# Patient Record
Sex: Male | Born: 1940 | Race: White | Hispanic: No | Smoking: Never smoker
Health system: Southern US, Community
[De-identification: ages and names within clinical notes are randomized; demographics above are authoritative.]

## PROBLEM LIST (undated history)

## (undated) DIAGNOSIS — F329 Major depressive disorder, single episode, unspecified: Secondary | ICD-10-CM

## (undated) DIAGNOSIS — Z789 Other specified health status: Secondary | ICD-10-CM

## (undated) DIAGNOSIS — Z87442 Personal history of urinary calculi: Secondary | ICD-10-CM

## (undated) DIAGNOSIS — T4145XA Adverse effect of unspecified anesthetic, initial encounter: Secondary | ICD-10-CM

## (undated) DIAGNOSIS — F32A Depression, unspecified: Secondary | ICD-10-CM

## (undated) DIAGNOSIS — T8859XA Other complications of anesthesia, initial encounter: Secondary | ICD-10-CM

## (undated) DIAGNOSIS — M1990 Unspecified osteoarthritis, unspecified site: Secondary | ICD-10-CM

## (undated) DIAGNOSIS — F419 Anxiety disorder, unspecified: Secondary | ICD-10-CM

## (undated) DIAGNOSIS — R0600 Dyspnea, unspecified: Secondary | ICD-10-CM

## (undated) HISTORY — PX: NECK SURGERY: SHX720

## (undated) HISTORY — PX: KNEE ARTHROSCOPY: SUR90

---
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
## 2005-08-07 ENCOUNTER — Ambulatory Visit: Payer: Self-pay | Admitting: Internal Medicine

## 2005-08-07 ENCOUNTER — Ambulatory Visit (HOSPITAL_COMMUNITY): Admission: RE | Admit: 2005-08-07 | Discharge: 2005-08-07 | Payer: Self-pay | Admitting: Internal Medicine

## 2011-05-17 DIAGNOSIS — Z23 Encounter for immunization: Secondary | ICD-10-CM | POA: Diagnosis not present

## 2012-01-31 DIAGNOSIS — Z79899 Other long term (current) drug therapy: Secondary | ICD-10-CM | POA: Diagnosis not present

## 2012-01-31 DIAGNOSIS — M1990 Unspecified osteoarthritis, unspecified site: Secondary | ICD-10-CM | POA: Diagnosis not present

## 2012-01-31 DIAGNOSIS — Z125 Encounter for screening for malignant neoplasm of prostate: Secondary | ICD-10-CM | POA: Diagnosis not present

## 2012-02-07 DIAGNOSIS — M79609 Pain in unspecified limb: Secondary | ICD-10-CM | POA: Diagnosis not present

## 2012-02-07 DIAGNOSIS — Z1212 Encounter for screening for malignant neoplasm of rectum: Secondary | ICD-10-CM | POA: Diagnosis not present

## 2012-02-07 DIAGNOSIS — N200 Calculus of kidney: Secondary | ICD-10-CM | POA: Diagnosis not present

## 2012-02-07 DIAGNOSIS — M5020 Other cervical disc displacement, unspecified cervical region: Secondary | ICD-10-CM | POA: Diagnosis not present

## 2012-03-22 DIAGNOSIS — Z23 Encounter for immunization: Secondary | ICD-10-CM | POA: Diagnosis not present

## 2012-05-06 DIAGNOSIS — K1370 Unspecified lesions of oral mucosa: Secondary | ICD-10-CM | POA: Diagnosis not present

## 2012-05-06 DIAGNOSIS — M2749 Other cysts of jaw: Secondary | ICD-10-CM | POA: Diagnosis not present

## 2013-02-08 DIAGNOSIS — Z79899 Other long term (current) drug therapy: Secondary | ICD-10-CM | POA: Diagnosis not present

## 2013-02-08 DIAGNOSIS — M1990 Unspecified osteoarthritis, unspecified site: Secondary | ICD-10-CM | POA: Diagnosis not present

## 2013-02-16 DIAGNOSIS — M5020 Other cervical disc displacement, unspecified cervical region: Secondary | ICD-10-CM | POA: Diagnosis not present

## 2013-02-16 DIAGNOSIS — Z23 Encounter for immunization: Secondary | ICD-10-CM | POA: Diagnosis not present

## 2013-02-16 DIAGNOSIS — Z1212 Encounter for screening for malignant neoplasm of rectum: Secondary | ICD-10-CM | POA: Diagnosis not present

## 2013-02-16 DIAGNOSIS — R000 Tachycardia, unspecified: Secondary | ICD-10-CM | POA: Diagnosis not present

## 2013-02-16 DIAGNOSIS — M79609 Pain in unspecified limb: Secondary | ICD-10-CM | POA: Diagnosis not present

## 2013-02-16 DIAGNOSIS — N200 Calculus of kidney: Secondary | ICD-10-CM | POA: Diagnosis not present

## 2014-02-18 DIAGNOSIS — Z79899 Other long term (current) drug therapy: Secondary | ICD-10-CM | POA: Diagnosis not present

## 2014-02-18 DIAGNOSIS — N200 Calculus of kidney: Secondary | ICD-10-CM | POA: Diagnosis not present

## 2014-02-18 DIAGNOSIS — M1990 Unspecified osteoarthritis, unspecified site: Secondary | ICD-10-CM | POA: Diagnosis not present

## 2014-02-21 DIAGNOSIS — Z23 Encounter for immunization: Secondary | ICD-10-CM | POA: Diagnosis not present

## 2014-02-21 DIAGNOSIS — Z0001 Encounter for general adult medical examination with abnormal findings: Secondary | ICD-10-CM | POA: Diagnosis not present

## 2015-02-13 DIAGNOSIS — Z23 Encounter for immunization: Secondary | ICD-10-CM | POA: Diagnosis not present

## 2015-02-20 DIAGNOSIS — E669 Obesity, unspecified: Secondary | ICD-10-CM | POA: Diagnosis not present

## 2015-02-20 DIAGNOSIS — M1990 Unspecified osteoarthritis, unspecified site: Secondary | ICD-10-CM | POA: Diagnosis not present

## 2015-02-20 DIAGNOSIS — Z125 Encounter for screening for malignant neoplasm of prostate: Secondary | ICD-10-CM | POA: Diagnosis not present

## 2015-02-20 DIAGNOSIS — N200 Calculus of kidney: Secondary | ICD-10-CM | POA: Diagnosis not present

## 2015-03-16 DIAGNOSIS — N049 Nephrotic syndrome with unspecified morphologic changes: Secondary | ICD-10-CM | POA: Diagnosis not present

## 2015-03-16 DIAGNOSIS — Z23 Encounter for immunization: Secondary | ICD-10-CM | POA: Diagnosis not present

## 2015-03-16 DIAGNOSIS — Z6836 Body mass index (BMI) 36.0-36.9, adult: Secondary | ICD-10-CM | POA: Diagnosis not present

## 2015-03-16 DIAGNOSIS — I493 Ventricular premature depolarization: Secondary | ICD-10-CM | POA: Diagnosis not present

## 2015-03-16 DIAGNOSIS — M1990 Unspecified osteoarthritis, unspecified site: Secondary | ICD-10-CM | POA: Diagnosis not present

## 2016-04-01 DIAGNOSIS — Z23 Encounter for immunization: Secondary | ICD-10-CM | POA: Diagnosis not present

## 2016-04-02 DIAGNOSIS — Z79899 Other long term (current) drug therapy: Secondary | ICD-10-CM | POA: Diagnosis not present

## 2016-04-02 DIAGNOSIS — E669 Obesity, unspecified: Secondary | ICD-10-CM | POA: Diagnosis not present

## 2016-04-02 DIAGNOSIS — M1990 Unspecified osteoarthritis, unspecified site: Secondary | ICD-10-CM | POA: Diagnosis not present

## 2016-04-16 DIAGNOSIS — Z87442 Personal history of urinary calculi: Secondary | ICD-10-CM | POA: Diagnosis not present

## 2016-04-16 DIAGNOSIS — E669 Obesity, unspecified: Secondary | ICD-10-CM | POA: Diagnosis not present

## 2016-04-16 DIAGNOSIS — Z6835 Body mass index (BMI) 35.0-35.9, adult: Secondary | ICD-10-CM | POA: Diagnosis not present

## 2016-04-16 DIAGNOSIS — M5030 Other cervical disc degeneration, unspecified cervical region: Secondary | ICD-10-CM | POA: Diagnosis not present

## 2016-04-26 ENCOUNTER — Encounter (INDEPENDENT_AMBULATORY_CARE_PROVIDER_SITE_OTHER): Payer: Self-pay | Admitting: *Deleted

## 2016-05-07 DIAGNOSIS — H2513 Age-related nuclear cataract, bilateral: Secondary | ICD-10-CM | POA: Diagnosis not present

## 2016-05-09 ENCOUNTER — Other Ambulatory Visit (INDEPENDENT_AMBULATORY_CARE_PROVIDER_SITE_OTHER): Payer: Self-pay | Admitting: *Deleted

## 2016-05-09 ENCOUNTER — Encounter (INDEPENDENT_AMBULATORY_CARE_PROVIDER_SITE_OTHER): Payer: Self-pay | Admitting: *Deleted

## 2016-05-09 DIAGNOSIS — Z1211 Encounter for screening for malignant neoplasm of colon: Secondary | ICD-10-CM | POA: Insufficient documentation

## 2016-06-28 ENCOUNTER — Telehealth (INDEPENDENT_AMBULATORY_CARE_PROVIDER_SITE_OTHER): Payer: Self-pay | Admitting: *Deleted

## 2016-06-28 ENCOUNTER — Encounter (INDEPENDENT_AMBULATORY_CARE_PROVIDER_SITE_OTHER): Payer: Self-pay | Admitting: *Deleted

## 2016-06-28 NOTE — Telephone Encounter (Signed)
Patient needs trilyte 

## 2016-07-01 ENCOUNTER — Telehealth (INDEPENDENT_AMBULATORY_CARE_PROVIDER_SITE_OTHER): Payer: Self-pay | Admitting: *Deleted

## 2016-07-01 MED ORDER — PEG 3350-KCL-NA BICARB-NACL 420 G PO SOLR: 4000.0000 mL | Freq: Once | ORAL | 0 refills | Status: AC

## 2016-07-01 MED ORDER — PEG 3350-KCL-NA BICARB-NACL 420 G PO SOLR: 4000.0000 mL | Freq: Once | ORAL | 0 refills | Status: DC

## 2016-07-01 NOTE — Telephone Encounter (Signed)
Patient needs trilyte 

## 2016-07-16 ENCOUNTER — Telehealth (INDEPENDENT_AMBULATORY_CARE_PROVIDER_SITE_OTHER): Payer: Self-pay | Admitting: *Deleted

## 2016-07-16 NOTE — Telephone Encounter (Signed)
agree

## 2016-07-16 NOTE — Telephone Encounter (Signed)
Referring MD/PCP: fagan   Procedure: tcs  Reason/Indication:  screening  Has patient had this procedure before?  Yes, 2007  If so, when, by whom and where?    Is there a family history of colon cancer?  no  Who?  What age when diagnosed?    Is patient diabetic?   no      Does patient have prosthetic heart valve or mechanical valve?  no  Do you have a pacemaker?  no  Has patient ever had endocarditis? no  Has patient had joint replacement within last 12 months?  no  Does patient tend to be constipated or take laxatives? no  Does patient have a history of alcohol/drug use?  no  Is patient on Coumadin, Plavix and/or Aspirin? no  Medications: none  Allergies: pcn  Medication Adjustment per Dr Laural Golden:   Procedure date & time: 08/14/16 at 730

## 2016-08-14 ENCOUNTER — Encounter (HOSPITAL_COMMUNITY)
Admission: RE | Disposition: A | Discharge status: Home / Self Care | Payer: Self-pay | Source: Ambulatory Visit | Attending: Internal Medicine

## 2016-08-14 ENCOUNTER — Ambulatory Visit (HOSPITAL_COMMUNITY)
Admission: RE | Admit: 2016-08-14 | Discharge: 2016-08-14 | Disposition: A | Discharge status: Home / Self Care | Payer: Medicare Other | Source: Ambulatory Visit | Attending: Internal Medicine | Admitting: Internal Medicine

## 2016-08-14 ENCOUNTER — Encounter (HOSPITAL_COMMUNITY): Payer: Self-pay

## 2016-08-14 DIAGNOSIS — Z1211 Encounter for screening for malignant neoplasm of colon: Secondary | ICD-10-CM | POA: Insufficient documentation

## 2016-08-14 DIAGNOSIS — Z79899 Other long term (current) drug therapy: Secondary | ICD-10-CM | POA: Insufficient documentation

## 2016-08-14 DIAGNOSIS — D127 Benign neoplasm of rectosigmoid junction: Secondary | ICD-10-CM | POA: Diagnosis not present

## 2016-08-14 DIAGNOSIS — Z880 Allergy status to penicillin: Secondary | ICD-10-CM | POA: Diagnosis not present

## 2016-08-14 DIAGNOSIS — K648 Other hemorrhoids: Secondary | ICD-10-CM | POA: Diagnosis not present

## 2016-08-14 DIAGNOSIS — K644 Residual hemorrhoidal skin tags: Secondary | ICD-10-CM | POA: Diagnosis not present

## 2016-08-14 DIAGNOSIS — K635 Polyp of colon: Secondary | ICD-10-CM | POA: Insufficient documentation

## 2016-08-14 DIAGNOSIS — D126 Benign neoplasm of colon, unspecified: Secondary | ICD-10-CM | POA: Diagnosis not present

## 2016-08-14 DIAGNOSIS — K5730 Diverticulosis of large intestine without perforation or abscess without bleeding: Secondary | ICD-10-CM | POA: Insufficient documentation

## 2016-08-14 HISTORY — PX: COLONOSCOPY: SHX5424

## 2016-08-14 HISTORY — PX: POLYPECTOMY: SHX5525

## 2016-08-14 HISTORY — DX: Other specified health status: Z78.9

## 2016-08-14 SURGERY — COLONOSCOPY
Anesthesia: Moderate Sedation

## 2016-08-14 MED ORDER — MIDAZOLAM HCL 5 MG/5ML IJ SOLN
INTRAMUSCULAR | Status: DC | PRN
  Administered 2016-08-14 (×2): 2 mg via INTRAVENOUS
  Administered 2016-08-14 (×2): 1 mg via INTRAVENOUS

## 2016-08-14 MED ORDER — MEPERIDINE HCL 50 MG/ML IJ SOLN
INTRAMUSCULAR | Status: AC
  Filled 2016-08-14: qty 1

## 2016-08-14 MED ORDER — MEPERIDINE HCL 50 MG/ML IJ SOLN
INTRAMUSCULAR | Status: DC | PRN
Start: 2016-08-14 — End: 2016-08-14
  Administered 2016-08-14 (×2): 25 mg via INTRAVENOUS

## 2016-08-14 MED ORDER — SIMETHICONE 40 MG/0.6ML PO SUSP
ORAL | Status: AC
  Filled 2016-08-14: qty 30

## 2016-08-14 MED ORDER — SODIUM CHLORIDE 0.9 % IV SOLN
INTRAVENOUS | Status: DC
  Administered 2016-08-14: 07:00:00 via INTRAVENOUS

## 2016-08-14 MED ORDER — MIDAZOLAM HCL 5 MG/5ML IJ SOLN
INTRAMUSCULAR | Status: AC
  Filled 2016-08-14: qty 10

## 2016-08-14 NOTE — H&P (Signed)
Kevin Ball is an 76 y.o. male.   Chief Complaint: Patient is here for colonoscopy. HPI: Patient is 76 year old Caucasian male who is here for screening colonoscopy. He denies abdominal pain change in bowel habits or rectal bleeding. Last colonoscopy was normal in 2007. Family History is negative for CRC.  Past Medical History:  Diagnosis Date  . Medical history non-contributory     Past Surgical History:  Procedure Laterality Date  . KNEE ARTHROSCOPY Right   . NECK SURGERY      History reviewed. No pertinent family history. Social History:  reports that he has never smoked. He has never used smokeless tobacco. He reports that he does not drink alcohol or use drugs.  Allergies:  Allergies  Allergen Reactions  . Penicillins Rash    Has patient had a PCN reaction causing immediate rash, facial/tongue/throat swelling, SOB or lightheadedness with hypotension: Yes Has patient had a PCN reaction causing severe rash involving mucus membranes or skin necrosis: No Has patient had a PCN reaction that required hospitalization No Has patient had a PCN reaction occurring within the last 10 years: No If all of the above answers are "NO", then may proceed with Cephalosporin use.     Medications Prior to Admission  Medication Sig Dispense Refill  . acetaminophen (TYLENOL) 500 MG tablet Take 1,000 mg by mouth every 6 (six) hours as needed for moderate pain or headache.    . clonazePAM (KLONOPIN) 0.5 MG tablet Take 0.5 mg by mouth 3 (three) times daily as needed (leg cramps).      No results found for this or any previous visit (from the past 48 hour(s)). No results found.  ROS  Blood pressure (!) 147/86, pulse 75, temperature 98.2 F (36.8 C), temperature source Oral, resp. rate 20, height 5\' 9"  (1.753 m), weight 240 lb (108.9 kg), SpO2 98 %. Physical Exam  Constitutional: He appears well-developed and well-nourished.  HENT:  Mouth/Throat: Oropharynx is clear and moist.  Eyes:  Conjunctivae are normal. No scleral icterus.  Neck: No thyromegaly present.  Cardiovascular: Normal rate, regular rhythm and normal heart sounds.   No murmur heard. Respiratory: Effort normal and breath sounds normal.  GI: Soft. He exhibits no distension and no mass. There is no tenderness.  Musculoskeletal: He exhibits no edema.  Lymphadenopathy:    He has no cervical adenopathy.  Neurological: He is alert.  Skin: Skin is warm and dry.     Assessment/Plan Average risk screening colonoscopy.  Hildred Laser, MD 08/14/2016, 7:30 AM

## 2016-08-14 NOTE — Op Note (Signed)
[facility] Patient Name: Kevin Ball Procedure Date: 08/14/2016 7:09 AM MRN: [id number] Date of Birth: 1940-08-14 Attending MD: Hildred Laser , MD CSN: [id number] Age: 76 Admit Type: Outpatient Procedure:                Colonoscopy Indications:              Screening for colorectal malignant neoplasm Providers:                Hildred Laser, MD, Lurline Del, RN, Charlyne Petrin                            RN, RN Referring MD:             Asencion Noble, MD Medicines:                Meperidine 50 mg IV, Midazolam 6 mg IV Complications:            No immediate complications. Estimated Blood Loss:     Estimated blood loss: none. Estimated blood loss                            was minimal. Procedure:                Pre-Anesthesia Assessment:                           - Prior to the procedure, a History and Physical                            was performed, and patient medications and                            allergies were reviewed. The patient's tolerance of                            previous anesthesia was also reviewed. The risks                            and benefits of the procedure and the sedation                            options and risks were discussed with the patient.                            All questions were answered, and informed consent                            was obtained. Prior Anticoagulants: The patient has                            taken no previous anticoagulant or antiplatelet                            agents. ASA Grade Assessment: I - A normal, healthy  patient. After reviewing the risks and benefits,                            the patient was deemed in satisfactory condition to                            undergo the procedure.                           After obtaining informed consent, the colonoscope                            was passed under direct vision. Throughout the                            procedure, the  patient's blood pressure, pulse, and                            oxygen saturations were monitored continuously. The                            EC-[id number] ([id number]) scope was introduced through                            the anus and advanced to the the cecum, identified                            by appendiceal orifice and ileocecal valve. The                            colonoscopy was performed without difficulty. The                            patient tolerated the procedure well. The quality                            of the bowel preparation was excellent. The                            ileocecal valve, appendiceal orifice, and rectum                            were photographed. Scope In: 7:41:35 AM Scope Out: 8:01:35 AM Scope Withdrawal Time: 0 hours 11 minutes 12 seconds  Total Procedure Duration: 0 hours 20 minutes 0 seconds  Findings:      The perianal and digital rectal examinations were normal.      A single small-mouthed diverticulum was found in the distal sigmoid       colon.      A small polyp was found in the recto-sigmoid colon. The polyp was       sessile. The polyp was removed with a cold snare. Resection and       retrieval were complete. The pathology specimen was placed into Bottle       Number 1.      External  and internal hemorrhoids were found during retroflexion. The       hemorrhoids were small. Impression:               - Diverticulosis in the distal sigmoid colon.                           - One small polyp at the recto-sigmoid colon,                            removed with a cold snare. Resected and retrieved.                           - External and internal hemorrhoids. Moderate Sedation:      Moderate (conscious) sedation was administered by the endoscopy nurse       and supervised by the endoscopist. The following parameters were       monitored: oxygen saturation, heart rate, blood pressure, CO2       capnography and response to care. Total physician  intraservice time was       27 minutes. Recommendation:           - Patient has a contact number available for                            emergencies. The signs and symptoms of potential                            delayed complications were discussed with the                            patient. Return to normal activities tomorrow.                            Written discharge instructions were provided to the                            patient.                           - High fiber diet today.                           - Continue present medications.                           - Await pathology results.                           - Repeat colonoscopy date to be determined after                            pending pathology results are reviewed. Procedure Code(s):        --- Professional ---                           [phone], Colonoscopy, flexible; with removal of  tumor(s), polyp(s), or other lesion(s) by snare                            technique                           99152, Moderate sedation services provided by the                            same physician or other qualified health care                            professional performing the diagnostic or                            therapeutic service that the sedation supports,                            requiring the presence of an independent trained                            observer to assist in the monitoring of the                            patient's level of consciousness and physiological                            status; initial 15 minutes of intraservice time,                            patient age 35 years or older                           [phone], Moderate sedation services; each additional                            15 minutes intraservice time Diagnosis Code(s):        --- Professional ---                           Z12.11, Encounter for screening for malignant                             neoplasm of colon                           K64.8, Other hemorrhoids                           D12.7, Benign neoplasm of rectosigmoid junction                           K57.30, Diverticulosis of large intestine without                            perforation or abscess without bleeding CPT  copyright 2016 American Medical Association. All rights reserved. The codes documented in this report are preliminary and upon coder review may  be revised to meet current compliance requirements. Hildred Laser, MD Hildred Laser, MD 08/14/2016 8:08:20 AM This report has been signed electronically. Number of Addenda: 0

## 2016-08-14 NOTE — Discharge Instructions (Signed)
Resume usual medications and high fiber diet. No driving for 24 hours. Physician will call with biopsy results.  Colonoscopy, Adult, Care After This sheet gives you information about how to care for yourself after your procedure. Your doctor may also give you more specific instructions. If you have problems or questions, call your doctor. Follow these instructions at home: General instructions    For the first 24 hours after the procedure:  Do not drive or use machinery.  Do not sign important documents.  Do not drink alcohol.  Do your daily activities more slowly than normal.  Eat foods that are soft and easy to digest.  Rest often.  Take over-the-counter or prescription medicines only as told by your doctor.  It is up to you to get the results of your procedure. Ask your doctor, or the department performing the procedure, when your results will be ready. To help cramping and bloating:   Try walking around.  Put heat on your belly (abdomen) as told by your doctor. Use a heat source that your doctor recommends, such as a moist heat pack or a heating pad.  Put a towel between your skin and the heat source.  Leave the heat on for 20-30 minutes.  Remove the heat if your skin turns bright red. This is especially important if you cannot feel pain, heat, or cold. You can get burned. Eating and drinking   Drink enough fluid to keep your pee (urine) clear or pale yellow.  Return to your normal diet as told by your doctor. Avoid heavy or fried foods that are hard to digest.  Avoid drinking alcohol for as long as told by your doctor. Contact a doctor if:  You have blood in your poop (stool) 2-3 days after the procedure. Get help right away if:  You have more than a small amount of blood in your poop.  You see large clumps of tissue (blood clots) in your poop.  Your belly is swollen.  You feel sick to your stomach (nauseous).  You throw up (vomit).  You have a  fever.  You have belly pain that gets worse, and medicine does not help your pain. This information is not intended to replace advice given to you by your health care provider. Make sure you discuss any questions you have with your health care provider. Document Released: 05/18/2010 Document Revised: 01/08/2016 Document Reviewed: 01/08/2016 Elsevier Interactive Patient Education  2017 [employer]   Hemorrhoids Hemorrhoids are swollen veins in and around the rectum or anus. There are two types of hemorrhoids:  Internal hemorrhoids. These occur in the veins that are just inside the rectum. They may poke through to the outside and become irritated and painful.  External hemorrhoids. These occur in the veins that are outside of the anus and can be felt as a painful swelling or hard lump near the anus. Most hemorrhoids do not cause serious problems, and they can be managed with home treatments such as diet and lifestyle changes. If home treatments do not help your symptoms, procedures can be done to shrink or remove the hemorrhoids. What are the causes? This condition is caused by increased pressure in the anal area. This pressure may result from various things, including:  Constipation.  Straining to have a bowel movement.  Diarrhea.  Pregnancy.  Obesity.  Sitting for long periods of time.  Heavy lifting or other activity that causes you to strain.  Anal sex. What are the signs or symptoms? Symptoms of  this condition include:  Pain.  Anal itching or irritation.  Rectal bleeding.  Leakage of stool (feces).  Anal swelling.  One or more lumps around the anus. How is this diagnosed? This condition can often be diagnosed through a visual exam. Other exams or tests may also be done, such as:  Examination of the rectal area with a gloved hand (digital rectal exam).  Examination of the anal canal using a small tube (anoscope).  A blood test, if you have lost a significant  amount of blood.  A test to look inside the colon (sigmoidoscopy or colonoscopy). How is this treated? This condition can usually be treated at home. However, various procedures may be done if dietary changes, lifestyle changes, and other home treatments do not help your symptoms. These procedures can help make the hemorrhoids smaller or remove them completely. Some of these procedures involve surgery, and others do not. Common procedures include:  Rubber band ligation. Rubber bands are placed at the base of the hemorrhoids to cut off the blood supply to them.  Sclerotherapy. Medicine is injected into the hemorrhoids to shrink them.  Infrared coagulation. A type of light energy is used to get rid of the hemorrhoids.  Hemorrhoidectomy surgery. The hemorrhoids are surgically removed, and the veins that supply them are tied off.  Stapled hemorrhoidopexy surgery. A circular stapling device is used to remove the hemorrhoids and use staples to cut off the blood supply to them. Follow these instructions at home: Eating and drinking   Eat foods that have a lot of fiber in them, such as whole grains, beans, nuts, fruits, and vegetables. Ask your health care provider about taking products that have added fiber (fiber supplements).  Drink enough fluid to keep your urine clear or pale yellow. Managing pain and swelling   Take warm sitz baths for 20 minutes, 3-4 times a day to ease pain and discomfort.  If directed, apply ice to the affected area. Using ice packs between sitz baths may be helpful.  Put ice in a plastic bag.  Place a towel between your skin and the bag.  Leave the ice on for 20 minutes, 2-3 times a day. General instructions   Take over-the-counter and prescription medicines only as told by your health care provider.  Use medicated creams or suppositories as told.  Exercise regularly.  Go to the bathroom when you have the urge to have a bowel movement. Do not wait.  Avoid  straining to have bowel movements.  Keep the anal area dry and clean. Use wet toilet paper or moist towelettes after a bowel movement.  Do not sit on the toilet for long periods of time. This increases blood pooling and pain. Contact a health care provider if:  You have increasing pain and swelling that are not controlled by treatment or medicine.  You have uncontrolled bleeding.  You have difficulty having a bowel movement, or you are unable to have a bowel movement.  You have pain or inflammation outside the area of the hemorrhoids. This information is not intended to replace advice given to you by your health care provider. Make sure you discuss any questions you have with your health care provider. Document Released: 04/12/2000 Document Revised: 09/13/2015 Document Reviewed: 12/28/2014 Elsevier Interactive Patient Education  2017 [employer].    Diverticulosis Diverticulosis is a condition that develops when small pouches (diverticula) form in the wall of the large intestine (colon). The colon is where water is absorbed and stool is formed.  The pouches form when the inside layer of the colon pushes through weak spots in the outer layers of the colon. You may have a few pouches or many of them. What are the causes? The cause of this condition is not known. What increases the risk? The following factors may make you more likely to develop this condition:  Being older than age 40. Your risk for this condition increases with age. Diverticulosis is rare among people younger than age 52. By age 26, many people have it.  Eating a low-fiber diet.  Having frequent constipation.  Being overweight.  Not getting enough exercise.  Smoking.  Taking over-the-counter pain medicines, like aspirin and ibuprofen.  Having a family history of diverticulosis. What are the signs or symptoms? In most people, there are no symptoms of this condition. If you do have symptoms, they may  include:  Bloating.  Cramps in the abdomen.  Constipation or diarrhea.  Pain in the lower left side of the abdomen. How is this diagnosed? This condition is most often diagnosed during an exam for other colon problems. Because diverticulosis usually has no symptoms, it often cannot be diagnosed independently. This condition may be diagnosed by:  Using a flexible scope to examine the colon (colonoscopy).  Taking an X-ray of the colon after dye has been put into the colon (barium enema).  Doing a CT scan. How is this treated? You may not need treatment for this condition if you have never developed an infection related to diverticulosis. If you have had an infection before, treatment may include:  Eating a high-fiber diet. This may include eating more fruits, vegetables, and grains.  Taking a fiber supplement.  Taking a live bacteria supplement (probiotic).  Taking medicine to relax your colon.  Taking antibiotic medicines. Follow these instructions at home:  Drink 6-8 glasses of water or more each day to prevent constipation.  Try not to strain when you have a bowel movement.  If you have had an infection before:  Eat more fiber as directed by your health care provider or your diet and nutrition specialist (dietitian).  Take a fiber supplement or probiotic, if your health care provider approves.  Take over-the-counter and prescription medicines only as told by your health care provider.  If you were prescribed an antibiotic, take it as told by your health care provider. Do not stop taking the antibiotic even if you start to feel better.  Keep all follow-up visits as told by your health care provider. This is important. Contact a health care provider if:  You have pain in your abdomen.  You have bloating.  You have cramps.  You have not had a bowel movement in 3 days. Get help right away if:  Your pain gets worse.  Your bloating becomes very bad.  You have a  fever or chills, and your symptoms suddenly get worse.  You vomit.  You have bowel movements that are bloody or black.  You have bleeding from your rectum. Summary  Diverticulosis is a condition that develops when small pouches (diverticula) form in the wall of the large intestine (colon).  You may have a few pouches or many of them.  This condition is most often diagnosed during an exam for other colon problems.  If you have had an infection related to diverticulosis, treatment may include increasing the fiber in your diet, taking supplements, or taking medicines. This information is not intended to replace advice given to you by your health care provider.  Make sure you discuss any questions you have with your health care provider. Document Released: 01/11/2004 Document Revised: 03/04/2016 Document Reviewed: 03/04/2016 Elsevier Interactive Patient Education  2017 [employer].  Colon Polyps Polyps are tissue growths inside the body. Polyps can grow in many places, including the large intestine (colon). A polyp may be a round bump or a mushroom-shaped growth. You could have one polyp or several. Most colon polyps are noncancerous (benign). However, some colon polyps can become cancerous over time. What are the causes? The exact cause of colon polyps is not known. What increases the risk? This condition is more likely to develop in people who:  Have a family history of colon cancer or colon polyps.  Are older than 75 or older than 45 if they are African American.  Have inflammatory bowel disease, such as ulcerative colitis or Crohn disease.  Are overweight.  Smoke cigarettes.  Do not get enough exercise.  Drink too much alcohol.  Eat a diet that is:  High in fat and red meat.  Low in fiber.  Had childhood cancer that was treated with abdominal radiation. What are the signs or symptoms? Most polyps do not cause symptoms. If you have symptoms, they may  include:  Blood coming from your rectum when having a bowel movement.  Blood in your stool.The stool may look dark red or black.  A change in bowel habits, such as constipation or diarrhea. How is this diagnosed? This condition is diagnosed with a colonoscopy. This is a procedure that uses a lighted, flexible scope to look at the inside of your colon. How is this treated? Treatment for this condition involves removing any polyps that are found. Those polyps will then be tested for cancer. If cancer is found, your health care provider will talk to you about options for colon cancer treatment. Follow these instructions at home: Diet   Eat plenty of fiber, such as fruits, vegetables, and whole grains.  Eat foods that are high in calcium and vitamin D, such as milk, cheese, yogurt, eggs, liver, fish, and broccoli.  Limit foods high in fat, red meats, and processed meats, such as hot dogs, sausage, bacon, and lunch meats.  Maintain a healthy weight, or lose weight if recommended by your health care provider. General instructions   Do not smoke cigarettes.  Do not drink alcohol excessively.  Keep all follow-up visits as told by your health care provider. This is important. This includes keeping regularly scheduled colonoscopies. Talk to your health care provider about when you need a colonoscopy.  Exercise every day or as told by your health care provider. Contact a health care provider if:  You have new or worsening bleeding during a bowel movement.  You have new or increased blood in your stool.  You have a change in bowel habits.  You unexpectedly lose weight. This information is not intended to replace advice given to you by your health care provider. Make sure you discuss any questions you have with your health care provider. Document Released: 01/10/2004 Document Revised: 09/21/2015 Document Reviewed: 03/06/2015 Elsevier Interactive Patient Education  2017 [employer].

## 2016-08-21 ENCOUNTER — Encounter (HOSPITAL_COMMUNITY): Payer: Self-pay | Admitting: Internal Medicine

## 2017-02-28 DIAGNOSIS — Z23 Encounter for immunization: Secondary | ICD-10-CM | POA: Diagnosis not present

## 2017-04-17 DIAGNOSIS — M5020 Other cervical disc displacement, unspecified cervical region: Secondary | ICD-10-CM | POA: Diagnosis not present

## 2017-04-17 DIAGNOSIS — E6609 Other obesity due to excess calories: Secondary | ICD-10-CM | POA: Diagnosis not present

## 2017-04-17 DIAGNOSIS — G4762 Sleep related leg cramps: Secondary | ICD-10-CM | POA: Diagnosis not present

## 2017-04-17 DIAGNOSIS — Z79899 Other long term (current) drug therapy: Secondary | ICD-10-CM | POA: Diagnosis not present

## 2017-04-17 DIAGNOSIS — N200 Calculus of kidney: Secondary | ICD-10-CM | POA: Diagnosis not present

## 2018-03-25 DIAGNOSIS — Z23 Encounter for immunization: Secondary | ICD-10-CM | POA: Diagnosis not present

## 2018-05-08 DIAGNOSIS — M5080 Other cervical disc disorders, unspecified cervical region: Secondary | ICD-10-CM | POA: Diagnosis not present

## 2018-05-08 DIAGNOSIS — Z79899 Other long term (current) drug therapy: Secondary | ICD-10-CM | POA: Diagnosis not present

## 2018-05-08 DIAGNOSIS — E6609 Other obesity due to excess calories: Secondary | ICD-10-CM | POA: Diagnosis not present

## 2018-05-14 DIAGNOSIS — G4762 Sleep related leg cramps: Secondary | ICD-10-CM | POA: Diagnosis not present

## 2018-05-14 DIAGNOSIS — N200 Calculus of kidney: Secondary | ICD-10-CM | POA: Diagnosis not present

## 2018-05-14 DIAGNOSIS — Z6836 Body mass index (BMI) 36.0-36.9, adult: Secondary | ICD-10-CM | POA: Diagnosis not present

## 2018-05-14 DIAGNOSIS — M5090 Cervical disc disorder, unspecified, unspecified cervical region: Secondary | ICD-10-CM | POA: Diagnosis not present

## 2018-05-14 DIAGNOSIS — Z0001 Encounter for general adult medical examination with abnormal findings: Secondary | ICD-10-CM | POA: Diagnosis not present

## 2018-06-15 DIAGNOSIS — J111 Influenza due to unidentified influenza virus with other respiratory manifestations: Secondary | ICD-10-CM | POA: Diagnosis not present

## 2018-11-20 ENCOUNTER — Other Ambulatory Visit (HOSPITAL_COMMUNITY): Payer: Self-pay | Admitting: Internal Medicine

## 2018-11-20 ENCOUNTER — Other Ambulatory Visit: Payer: Self-pay

## 2018-11-20 ENCOUNTER — Ambulatory Visit (HOSPITAL_COMMUNITY)
Admission: RE | Admit: 2018-11-20 | Discharge: 2018-11-20 | Disposition: A | Discharge status: Home / Self Care | Payer: Medicare Other | Source: Ambulatory Visit | Attending: Internal Medicine | Admitting: Internal Medicine

## 2018-11-20 DIAGNOSIS — R102 Pelvic and perineal pain: Secondary | ICD-10-CM | POA: Diagnosis not present

## 2018-11-20 DIAGNOSIS — M1612 Unilateral primary osteoarthritis, left hip: Secondary | ICD-10-CM | POA: Diagnosis not present

## 2018-11-20 DIAGNOSIS — M25552 Pain in left hip: Secondary | ICD-10-CM

## 2018-11-24 DIAGNOSIS — M1612 Unilateral primary osteoarthritis, left hip: Secondary | ICD-10-CM | POA: Diagnosis not present

## 2019-01-05 DIAGNOSIS — M25552 Pain in left hip: Secondary | ICD-10-CM | POA: Diagnosis not present

## 2019-01-07 DIAGNOSIS — M1612 Unilateral primary osteoarthritis, left hip: Secondary | ICD-10-CM | POA: Diagnosis not present

## 2019-02-12 DIAGNOSIS — Z23 Encounter for immunization: Secondary | ICD-10-CM | POA: Diagnosis not present

## 2019-03-30 DIAGNOSIS — M25552 Pain in left hip: Secondary | ICD-10-CM | POA: Diagnosis not present

## 2019-03-31 ENCOUNTER — Other Ambulatory Visit: Payer: Self-pay | Admitting: Emergency Medicine

## 2019-03-31 ENCOUNTER — Other Ambulatory Visit: Payer: Self-pay | Admitting: Orthopedic Surgery

## 2019-03-31 ENCOUNTER — Other Ambulatory Visit (HOSPITAL_COMMUNITY): Payer: Self-pay | Admitting: Orthopedic Surgery

## 2019-03-31 DIAGNOSIS — M25552 Pain in left hip: Secondary | ICD-10-CM

## 2019-04-06 ENCOUNTER — Ambulatory Visit (HOSPITAL_COMMUNITY)
Admission: RE | Admit: 2019-04-06 | Discharge: 2019-04-06 | Disposition: A | Discharge status: Home / Self Care | Payer: Medicare Other | Source: Ambulatory Visit | Attending: Orthopedic Surgery | Admitting: Orthopedic Surgery

## 2019-04-06 ENCOUNTER — Other Ambulatory Visit: Payer: Self-pay

## 2019-04-06 DIAGNOSIS — M1612 Unilateral primary osteoarthritis, left hip: Secondary | ICD-10-CM | POA: Diagnosis not present

## 2019-04-06 DIAGNOSIS — M25552 Pain in left hip: Secondary | ICD-10-CM | POA: Diagnosis not present

## 2019-04-12 DIAGNOSIS — M1612 Unilateral primary osteoarthritis, left hip: Secondary | ICD-10-CM | POA: Diagnosis not present

## 2019-04-14 DIAGNOSIS — M1611 Unilateral primary osteoarthritis, right hip: Secondary | ICD-10-CM | POA: Diagnosis present

## 2019-04-29 ENCOUNTER — Encounter (HOSPITAL_COMMUNITY): Payer: Self-pay

## 2019-04-29 NOTE — Patient Instructions (Addendum)
DUE TO COVID-19 ONLY ONE VISITOR IS ALLOWED TO COME WITH YOU AND STAY IN THE WAITING ROOM ONLY DURING PRE OP AND PROCEDURE DAY OF SURGERY.  THE 1 VISITOR MAY VISIT WITH YOU AFTER SURGERY IN YOUR PRIVATE ROOM DURING VISITING HOURS ONLY!   YOU NEED TO HAVE A COVID 19 TEST ON_1/8______ @__10 :05_____, THIS TEST MUST BE DONE BEFORE SURGERY, COME  [address], [place] , [address].  ([place])  ONCE YOUR COVID TEST IS COMPLETED, PLEASE BEGIN THE QUARANTINE INSTRUCTIONS AS OUTLINED IN YOUR HANDOUT.                OMARRI NGAN   Your procedure is scheduled on: 05/11/19   Report to [facility] Main  Entrance   Report to admitting at  7:35  AM     Call this number if you have problems the morning of surgery [place], NO CHEWING GUM [place].     Take these medicines the morning of surgery with A SIP OF WATER: Klonopin (clonazepam)   Do not eat food After Midnight.    YOU MAY HAVE CLEAR LIQUIDS FROM MIDNIGHT UNTIL 7:00 AM   CLEAR LIQUID DIET   Foods Allowed                                                                     Foods Excluded  Coffee and tea, regular and decaf                             liquids that you cannot  Plain Jell-O any favor except red or purple                                           see through such as: Fruit ices (not with fruit pulp)                                     milk, soups, orange juice  Iced Popsicles                                    All solid food Carbonated beverages, regular and diet                                    Cranberry, grape and apple juices Sports drinks like Gatorade Lightly seasoned clear broth or consume(fat free) Sugar, honey syrup     . At 7:00 AM Please finish the prescribed Pre-Surgery  Drink.    Nothing by mouth after you finish the drink !                                 You may not have any metal  on your body including               piercings  Do not wear jewelry,  lotions, powders or  deodorant                         Men may shave face and neck.   Do not bring valuables to the hospital. [place].  Contacts, dentures or bridgework may not be worn into surgery.    Patients discharged the day of surgery will not be allowed to drive home.   IF YOU ARE HAVING SURGERY AND GOING HOME THE SAME DAY,  YOU MUST HAVE AN ADULT TO DRIVE YOU HOME AND BE WITH YOU FOR 24 HOURS.   YOU MAY GO HOME BY TAXI OR UBER OR ORTHERWISE, BUT AN ADULT MUST ACCOMPANY YOU HOME AND STAY WITH YOU FOR 24 HOURS.  Name and phone number of your driver:  Special Instructions: N/A              Please read over the following fact sheets you were given: _____________________________________________________________________             [facility] - Preparing for Surgery  Before surgery, you can play an important role.   Because skin is not sterile, your skin needs to be as free of germs as possible.   You can reduce the number of germs on your skin by washing with CHG (chlorahexidine gluconate) soap before surgery .  CHG is an antiseptic cleaner which kills germs and bonds with the skin to continue killing germs even after washing. Please DO NOT use if you have an allergy to CHG or antibacterial soaps.   If your skin becomes reddened/irritated stop using the CHG and inform your nurse when you arrive at Short Stay. .  You may shave your face/neck.  Please follow these instructions carefully:  1.  Shower with CHG Soap the night before surgery and the  morning of Surgery.  2.  If you choose to wash your hair, wash your hair first as usual with your  normal  shampoo.  3.  After you shampoo, rinse your hair and body thoroughly to remove the  shampoo.                                        4.  Use CHG as you would any other liquid soap.  You can apply chg directly  to  the skin and wash                       Gently with a scrungie or clean washcloth.  5.  Apply the CHG Soap to your body ONLY FROM THE NECK DOWN.   Do not use on face/ open                           Wound or open sores. Avoid contact with eyes, ears mouth and genitals (private parts).                       Wash face,  Genitals (private parts) with your normal soap.  6.  Wash thoroughly, paying special attention to the area where your surgery  will be performed.  7.  Thoroughly rinse your body with warm water from the neck down.  8.  DO NOT shower/wash with your normal soap after using and rinsing off  the CHG Soap.             9.  Pat yourself dry with a clean towel.            10.  Wear clean pajamas.            11.  Place clean sheets on your bed the night of your first shower and do not  sleep with pets. Day of Surgery : Do not apply any lotions/deodorants the morning of surgery.  Please wear clean clothes to the hospital/surgery center.  FAILURE TO FOLLOW THESE INSTRUCTIONS MAY RESULT IN THE CANCELLATION OF YOUR SURGERY PATIENT SIGNATURE_________________________________  NURSE SIGNATURE__________________________________  ________________________________________________________________________   Adam Phenix  An incentive spirometer is a tool that can help keep your lungs clear and active. This tool measures how well you are filling your lungs with each breath. Taking long deep breaths may help reverse or decrease the chance of developing breathing (pulmonary) problems (especially infection) following:  A long period of time when you are unable to move or be active. BEFORE THE PROCEDURE   If the spirometer includes an indicator to show your best effort, your nurse or respiratory therapist will set it to a desired goal.  If possible, sit up straight or lean slightly forward. Try not to slouch.  Hold the incentive spirometer in an upright position. INSTRUCTIONS  FOR USE  1. Sit on the edge of your bed if possible, or sit up as far as you can in bed or on a chair. 2. Hold the incentive spirometer in an upright position. 3. Breathe out normally. 4. Place the mouthpiece in your mouth and seal your lips tightly around it. 5. Breathe in slowly and as deeply as possible, raising the piston or the ball toward the top of the column. 6. Hold your breath for 3-5 seconds or for as long as possible. Allow the piston or ball to fall to the bottom of the column. 7. Remove the mouthpiece from your mouth and breathe out normally. 8. Rest for a few seconds and repeat Steps 1 through 7 at least 10 times every 1-2 hours when you are awake. Take your time and take a few normal breaths between deep breaths. 9. The spirometer may include an indicator to show your best effort. Use the indicator as a goal to work toward during each repetition. 10. After each set of 10 deep breaths, practice coughing to be sure your lungs are clear. If you have an incision (the cut made at the time of surgery), support your incision when coughing by placing a pillow or rolled up towels firmly against it. Once you are able to get out of bed, walk around indoors and cough well. You may stop using the incentive spirometer when instructed by your caregiver.  RISKS AND COMPLICATIONS  Take your time so you do not get dizzy or light-headed.  If you are in pain, you may need to take or ask for pain medication before doing incentive spirometry. It is harder to take a deep breath if you are having pain. AFTER USE  Rest and breathe slowly and easily.  It can be helpful to keep track of a log of your progress. Your caregiver  can provide you with a simple table to help with this. If you are using the spirometer at home, follow these instructions: [place] IF:   You are having difficultly using the spirometer.  You have trouble using the spirometer as often as instructed.  Your pain  medication is not giving enough relief while using the spirometer.  You develop fever of 100.5 F (38.1 C) or higher. SEEK IMMEDIATE MEDICAL CARE IF:   You cough up bloody sputum that had not been present before.  You develop fever of 102 F (38.9 C) or greater.  You develop worsening pain at or near the incision site. MAKE SURE YOU:   Understand these instructions.  Will watch your condition.  Will get help right away if you are not doing well or get worse. Document Released: 08/26/2006 Document Revised: 07/08/2011 Document Reviewed: 10/27/2006 [facility] Patient Information 2014 [place], [place].   ________________________________________________________________________

## 2019-04-29 NOTE — Progress Notes (Signed)
PCP -  Cardiologist -   Chest x-ray -  EKG -  Stress Test -  ECHO -  Cardiac Cath -   Sleep Study -  CPAP -   Fasting Blood Sugar -  Checks Blood Sugar _____ times a day  Blood Thinner Instructions: Aspirin Instructions: Last Dose:  Anesthesia review:   Patient denies shortness of breath, fever, cough and chest pain at PAT appointment   Patient verbalized understanding of instructions that were given to them at the PAT appointment. Patient was also instructed that they will need to review over the PAT instructions again at home before surgery.

## 2019-05-03 ENCOUNTER — Encounter (HOSPITAL_COMMUNITY): Payer: Self-pay

## 2019-05-03 ENCOUNTER — Other Ambulatory Visit: Payer: Self-pay

## 2019-05-03 ENCOUNTER — Encounter (HOSPITAL_COMMUNITY): Admission: RE | Admit: 2019-05-03 | Payer: Medicare HMO | Source: Ambulatory Visit

## 2019-05-03 ENCOUNTER — Encounter (HOSPITAL_COMMUNITY)
Admission: RE | Admit: 2019-05-03 | Discharge: 2019-05-03 | Disposition: A | Discharge status: Home / Self Care | Payer: Medicare HMO | Source: Ambulatory Visit | Attending: Orthopedic Surgery | Admitting: Orthopedic Surgery

## 2019-05-03 DIAGNOSIS — Z01812 Encounter for preprocedural laboratory examination: Secondary | ICD-10-CM | POA: Diagnosis not present

## 2019-05-03 HISTORY — DX: Depression, unspecified: F32.A

## 2019-05-03 HISTORY — DX: Personal history of urinary calculi: Z87.442

## 2019-05-03 HISTORY — DX: Anxiety disorder, unspecified: F41.9

## 2019-05-03 HISTORY — DX: Unspecified osteoarthritis, unspecified site: M19.90

## 2019-05-03 NOTE — Progress Notes (Signed)
PCP - Dr. Salena Saner Cardiologist -none   Chest x-ray - no EKG - no Stress Test - no ECHO - no Cardiac Cath - no  Sleep Study - no CPAP -   Fasting Blood Sugar - NA  Checks Blood Sugar _____ times a day  Blood Thinner Instructions:NA Aspirin Instructions: Last Dose:  Anesthesia review:   Patient denies shortness of breath, fever, cough and chest pain at PAT appointment yes  Patient verbalized understanding of instructions that were given to them at the PAT appointment. Patient was also instructed that they will need to review over the PAT instructions again at home before surgery. yes

## 2019-05-04 ENCOUNTER — Encounter (HOSPITAL_COMMUNITY)
Admission: RE | Admit: 2019-05-04 | Discharge: 2019-05-04 | Disposition: A | Discharge status: Home / Self Care | Payer: Medicare HMO | Source: Ambulatory Visit | Attending: Orthopedic Surgery | Admitting: Orthopedic Surgery

## 2019-05-04 DIAGNOSIS — Z01812 Encounter for preprocedural laboratory examination: Secondary | ICD-10-CM | POA: Diagnosis not present

## 2019-05-04 LAB — BASIC METABOLIC PANEL
Anion gap: 9 (ref 5–15)
BUN: 16 mg/dL (ref 8–23)
CO2: 25 mmol/L (ref 22–32)
Calcium: 9.5 mg/dL (ref 8.9–10.3)
Chloride: 107 mmol/L (ref 98–111)
Creatinine, Ser: 1.22 mg/dL (ref 0.61–1.24)
GFR calc Af Amer: >60 mL/min (ref >60)
GFR calc non Af Amer: 57 mL/min — ABNORMAL LOW (ref >60)
Glucose, Bld: 92 mg/dL (ref 70–99)
Potassium: 4.6 mmol/L (ref 3.5–5.1)
Sodium: 141 mmol/L (ref 135–145)

## 2019-05-04 LAB — CBC
HCT: 47.3 % (ref 39.0–52.0)
Hemoglobin: 15.3 g/dL (ref 13.0–17.0)
MCH: 31.9 pg (ref 26.0–34.0)
MCHC: 32.3 g/dL (ref 30.0–36.0)
MCV: 98.7 fL (ref 80.0–100.0)
Platelets: 238 10*3/uL (ref 150–400)
RBC: 4.79 MIL/uL (ref 4.22–5.81)
RDW: 12.5 % (ref 11.5–15.5)
WBC: 9.2 10*3/uL (ref 4.0–10.5)
nRBC: 0 % (ref 0.0–0.2)

## 2019-05-04 LAB — SURGICAL PCR SCREEN
MRSA, PCR: NEGATIVE
Staphylococcus aureus: NEGATIVE

## 2019-05-06 ENCOUNTER — Encounter (HOSPITAL_COMMUNITY): Payer: Self-pay | Admitting: Physician Assistant

## 2019-05-06 ENCOUNTER — Encounter (HOSPITAL_COMMUNITY): Payer: Self-pay | Admitting: Certified Registered"

## 2019-05-07 ENCOUNTER — Other Ambulatory Visit (HOSPITAL_COMMUNITY)
Admission: RE | Admit: 2019-05-07 | Discharge: 2019-05-07 | Disposition: A | Discharge status: Home / Self Care | Payer: Medicare HMO | Source: Ambulatory Visit | Attending: Orthopedic Surgery | Admitting: Orthopedic Surgery

## 2019-05-07 DIAGNOSIS — Z01812 Encounter for preprocedural laboratory examination: Secondary | ICD-10-CM | POA: Insufficient documentation

## 2019-05-07 DIAGNOSIS — Z20822 Contact with and (suspected) exposure to covid-19: Secondary | ICD-10-CM | POA: Insufficient documentation

## 2019-05-08 LAB — NOVEL CORONAVIRUS, NAA (HOSP ORDER, SEND-OUT TO REF LAB; TAT 18-24 HRS): SARS-CoV-2, NAA: NOT DETECTED

## 2019-05-10 MED ORDER — VANCOMYCIN HCL 1500 MG/300ML IV SOLN
1500.0000 mg | INTRAVENOUS | Status: DC
  Filled 2019-05-10: qty 300

## 2019-05-10 NOTE — Care Plan (Signed)
Ortho Bundle Case Management Note  Patient Details  Name: Kevin Ball MRN: [id number] Date of Birth: [birth year]  Spoke with patient prior to surgery. He plans to discharge to home with family. HHPT referral to [facility]. OPPT set up with [place]. Rolling walker ordered for home use. paitent and MD in agreement with plan. Choice offered.                 DME Arranged:  [place]ford Rile rolling DME Agency:  [employer]  HH Arranged:  PT [place] Agency:  [facility] (now [facility])  Additional Comments: Please contact me with any questions of if this plan should need to change.  Ladell Heads,  [facility] [facility]ist  [phone] 05/10/2019, 9:42 AM

## 2019-05-11 ENCOUNTER — Ambulatory Visit (HOSPITAL_COMMUNITY): Payer: Medicare HMO | Admitting: Physician Assistant

## 2019-05-11 ENCOUNTER — Encounter (HOSPITAL_COMMUNITY): Admission: RE | Payer: Self-pay | Source: Home / Self Care

## 2019-05-11 ENCOUNTER — Ambulatory Visit (HOSPITAL_COMMUNITY): Admission: RE | Admit: 2019-05-11 | Payer: Medicare HMO | Source: Home / Self Care | Admitting: Orthopedic Surgery

## 2019-05-11 SURGERY — ARTHROPLASTY, HIP, TOTAL,POSTERIOR APPROACH
Anesthesia: Choice | Site: Hip | Laterality: Right

## 2019-05-14 ENCOUNTER — Other Ambulatory Visit (HOSPITAL_COMMUNITY)
Admission: RE | Admit: 2019-05-14 | Discharge: 2019-05-14 | Disposition: A | Discharge status: Home / Self Care | Payer: Medicare HMO | Source: Ambulatory Visit | Attending: Orthopedic Surgery | Admitting: Orthopedic Surgery

## 2019-05-14 DIAGNOSIS — Z20822 Contact with and (suspected) exposure to covid-19: Secondary | ICD-10-CM | POA: Diagnosis not present

## 2019-05-14 DIAGNOSIS — Z01812 Encounter for preprocedural laboratory examination: Secondary | ICD-10-CM | POA: Insufficient documentation

## 2019-05-14 NOTE — Progress Notes (Signed)
Pt surgery cancelled because of change in insurance carrier, and a need for pre-approval from the new carrier. Pt now scheduled for surgery on 05-18-19 at 1:00 PM.  Pt to reports to Admitting at 10:00 AM, and can have a Clear Liquid Diet from Midnight until 7:00 AM. Pt states that he prefers not to eat or drink after midnight, and want "to tough it out".

## 2019-05-15 LAB — NOVEL CORONAVIRUS, NAA (HOSP ORDER, SEND-OUT TO REF LAB; TAT 18-24 HRS): SARS-CoV-2, NAA: NOT DETECTED

## 2019-05-16 NOTE — Care Plan (Signed)
Ortho Bundle Case Management Note  Patient Details  Name: Kevin Ball MRN: [id number] Date of Birth: 1941-04-01  Spoke with patient prior to surgery. He plans to discharge to home with family. HHPT referral to [facility]. OPPT set up with [place]. Rolling walker ordered for home use. paitent and MD in agreement with plan. Choice offered.                 DME Arranged:    DME Agency:     HH Arranged:    HH Agency:     Additional Comments: Please contact me with any questions of if this plan should need to change.    05/16/2019, 9:56 PM

## 2019-05-18 ENCOUNTER — Encounter (HOSPITAL_COMMUNITY)
Admission: RE | Disposition: A | Discharge status: Home / Self Care | Payer: Self-pay | Source: Home / Self Care | Attending: Orthopedic Surgery

## 2019-05-18 ENCOUNTER — Encounter (HOSPITAL_COMMUNITY): Payer: Self-pay | Admitting: Orthopedic Surgery

## 2019-05-18 ENCOUNTER — Ambulatory Visit (HOSPITAL_COMMUNITY)
Admission: RE | Admit: 2019-05-18 | Discharge: 2019-05-18 | Disposition: A | Discharge status: Home / Self Care | Payer: Medicare HMO | Attending: Orthopedic Surgery | Admitting: Orthopedic Surgery

## 2019-05-18 DIAGNOSIS — I471 Supraventricular tachycardia: Secondary | ICD-10-CM

## 2019-05-18 DIAGNOSIS — Z880 Allergy status to penicillin: Secondary | ICD-10-CM | POA: Diagnosis not present

## 2019-05-18 DIAGNOSIS — R030 Elevated blood-pressure reading, without diagnosis of hypertension: Secondary | ICD-10-CM | POA: Insufficient documentation

## 2019-05-18 DIAGNOSIS — F419 Anxiety disorder, unspecified: Secondary | ICD-10-CM | POA: Insufficient documentation

## 2019-05-18 DIAGNOSIS — F329 Major depressive disorder, single episode, unspecified: Secondary | ICD-10-CM | POA: Insufficient documentation

## 2019-05-18 DIAGNOSIS — I493 Ventricular premature depolarization: Secondary | ICD-10-CM

## 2019-05-18 DIAGNOSIS — M1990 Unspecified osteoarthritis, unspecified site: Secondary | ICD-10-CM | POA: Insufficient documentation

## 2019-05-18 DIAGNOSIS — Z5309 Procedure and treatment not carried out because of other contraindication: Secondary | ICD-10-CM | POA: Insufficient documentation

## 2019-05-18 DIAGNOSIS — R55 Syncope and collapse: Secondary | ICD-10-CM

## 2019-05-18 HISTORY — DX: Other complications of anesthesia, initial encounter: T88.59XA

## 2019-05-18 HISTORY — DX: Dyspnea, unspecified: R06.00

## 2019-05-18 SURGERY — ARTHROPLASTY, HIP, TOTAL,POSTERIOR APPROACH
Anesthesia: Spinal | Site: Hip | Laterality: Left

## 2019-05-18 MED ORDER — ACETAMINOPHEN 500 MG PO TABS
1000.0000 mg | ORAL_TABLET | Freq: Once | ORAL | Status: DC
  Filled 2019-05-18: qty 2

## 2019-05-18 MED ORDER — PROPOFOL 500 MG/50ML IV EMUL
INTRAVENOUS | Status: AC
  Filled 2019-05-18: qty 50

## 2019-05-18 MED ORDER — BUPIVACAINE HCL (PF) 0.25 % IJ SOLN
INTRAMUSCULAR | Status: AC
  Filled 2019-05-18: qty 30

## 2019-05-18 MED ORDER — LACTATED RINGERS IV SOLN: INTRAVENOUS | Status: DC

## 2019-05-18 MED ORDER — FENTANYL CITRATE (PF) 100 MCG/2ML IJ SOLN
INTRAMUSCULAR | Status: AC
  Filled 2019-05-18: qty 2

## 2019-05-18 MED ORDER — POVIDONE-IODINE 10 % EX SWAB: 2.0000 "application " | Freq: Once | CUTANEOUS | Status: DC

## 2019-05-18 MED ORDER — PROPOFOL 10 MG/ML IV BOLUS
INTRAVENOUS | Status: AC
  Filled 2019-05-18: qty 20

## 2019-05-18 MED ORDER — CHLORHEXIDINE GLUCONATE 4 % EX LIQD: 60.0000 mL | Freq: Once | CUTANEOUS | Status: DC

## 2019-05-18 MED ORDER — TRANEXAMIC ACID-NACL 1000-0.7 MG/100ML-% IV SOLN
1000.0000 mg | INTRAVENOUS | Status: DC
  Filled 2019-05-18: qty 100

## 2019-05-18 MED ORDER — DEXAMETHASONE SODIUM PHOSPHATE 10 MG/ML IJ SOLN
INTRAMUSCULAR | Status: AC
  Filled 2019-05-18: qty 1

## 2019-05-18 MED ORDER — KETOROLAC TROMETHAMINE 30 MG/ML IJ SOLN
INTRAMUSCULAR | Status: AC
  Filled 2019-05-18: qty 1

## 2019-05-18 MED ORDER — ONDANSETRON HCL 4 MG/2ML IJ SOLN
INTRAMUSCULAR | Status: AC
  Filled 2019-05-18: qty 2

## 2019-05-18 NOTE — Consult Note (Signed)
Cardiology Consultation:   Patient ID: Kevin Ball MRN: [id number]; DOB: 1941-03-11  Admit date: 05/18/2019 Date of Consult: 05/18/2019  Primary Care Provider: Asencion Noble, MD Primary Cardiologist:New   Patient Profile:   Kevin Ball is a 79 y.o. male with no prior significant past medical history who is being seen today for the evaluation of tachycardia at the request of Dr. Mardelle Matte.   History of Present Illness:   Kevin Ball presented to [facility] for outpatient left total hip arthroplasty however noted tachycardic on the monitor by anesthesiologist and cardiology is called.  Review of strips looks like atrial tachycardia at rate of 140 bpm-personally reviewed. He is asymptomatic.  Patient denies prior cardiac or any medical history.  He does not take any medication on a regular basis.  He is very active.  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  Blood pressure 148/104.  Non-smoker and drinker.  No family history of CAD.  He denies chest pain, shortness of breath, orthopnea, PND, syncope, lower extremity edema, melena or dizziness.`  Heart Pathway Score:     Past Medical History:  Diagnosis Date  . Anxiety   . Arthritis   . Complication of anesthesia    "passed out"  . Depression   . Dyspnea   . History of kidney stones   . Medical history non-contributory     Past Surgical History:  Procedure Laterality Date  . COLONOSCOPY N/A 08/14/2016   Procedure: COLONOSCOPY;  Surgeon: Rogene Houston, MD;  [facility];  Service: Endoscopy;  Laterality: N/A;  730  . KNEE ARTHROSCOPY Right   . NECK SURGERY    . POLYPECTOMY  08/14/2016   Procedure: POLYPECTOMY;  Surgeon: Rogene Houston, MD;  Location: [facility];  Service: Endoscopy;;  recto-sigmoid x2     Inpatient Medications: Scheduled Meds: . acetaminophen  1,000 mg Oral Once  . chlorhexidine  60 mL Topical Once  . povidone-iodine  2 application Topical Once   Continuous Infusions: . lactated  ringers    . tranexamic acid     PRN Meds:   Allergies:    Allergies  Allergen Reactions  . Penicillins Rash    Has patient had a PCN reaction causing immediate rash, facial/tongue/throat swelling, SOB or lightheadedness with hypotension: Yes Has patient had a PCN reaction causing severe rash involving mucus membranes or skin necrosis: No Has patient had a PCN reaction that required hospitalization No Has patient had a PCN reaction occurring within the last 10 years: No If all of the above answers are "NO", then may proceed with Cephalosporin use.     Social History:   Social History   Socioeconomic History  . Marital status: Widowed    Spouse name: Not on file  . Number of children: Not on file  . Years of education: Not on file  . Highest education level: Not on file  Occupational History  . Not on file  Tobacco Use  . Smoking status: Never Smoker  . Smokeless tobacco: Never Used  Substance and Sexual Activity  . Alcohol use: No  . Drug use: No  . Sexual activity: Not on file  Other Topics Concern  . Not on file  Social History Narrative  . Not on file   Social Determinants of Health   Financial Resource Strain:   . Difficulty of Paying Living Expenses: Not on file  Food Insecurity:   . Worried About Charity fundraiser in the  Last Year: Not on file  . Ran Out of Food in the Last Year: Not on file  Transportation Needs:   . Lack of Transportation (Medical): Not on file  . Lack of Transportation (Non-Medical): Not on file  Physical Activity:   . Days of Exercise per Week: Not on file  . Minutes of Exercise per Session: Not on file  Stress:   . Feeling of Stress : Not on file  Social Connections:   . Frequency of Communication with Friends and Family: Not on file  . Frequency of Social Gatherings with Friends and Family: Not on file  . Attends Religious Services: Not on file  . Active Member of Clubs or Organizations: Not on file  . Attends Theatre manager Meetings: Not on file  . Marital Status: Not on file  Intimate Partner Violence:   . Fear of Current or Ex-Partner: Not on file  . Emotionally Abused: Not on file  . Physically Abused: Not on file  . Sexually Abused: Not on file    Family History:   Family History  Problem Relation Age of Onset  . Cancer Mother      ROS:  Please see the history of present illness.  All other ROS reviewed and negative.     Physical Exam/Data:   Vitals:   05/18/19 1035 05/18/19 1036  BP: 129/88 (!) 148/104  Pulse:  (!) 112  Temp: 97.8 F (36.6 C) 97.8 F (36.6 C)  TempSrc: Oral Oral  SpO2: 97% 98%  Weight: 108.6 kg   Height: 5\' 9"  (1.753 m)    No intake or output data in the 24 hours ending 05/18/19 1202 Last 3 Weights 05/18/2019 05/04/2019 05/03/2019  Weight (lbs) 239 lb 6 oz 244 lb 240 lb  Weight (kg) 108.58 kg 110.678 kg 108.863 kg     Body mass index is 35.35 kg/m.  General:  Well nourished, well developed, in no acute distress HEENT: normal Lymph: no adenopathy Neck: no JVD Endocrine:  No thryomegaly Vascular: No carotid bruits; FA pulses 2+ bilaterally without bruits  Cardiac:  normal S1, S2; RRR; no murmur Lungs:  clear to auscultation bilaterally, no wheezing, rhonchi or rales  Abd: soft, nontender, no hepatomegaly  Ext: no edema Musculoskeletal:  No deformities, BUE and BLE strength normal and equal Skin: warm and dry  Neuro:  CNs 2-12 intact, no focal abnormalities noted Psych:  Normal affect   EKG:  The EKG was personally reviewed and demonstrates: Sinus rhythm at rate of 93 bpm, PAC, nonspecific ST changes   Relevant CV Studies: As above  Laboratory Data:  High Sensitivity Troponin:  No results for input(s): TROPONINIHS in the last 720 hours.   ChemistryNo results for input(s): NA, K, CL, CO2, GLUCOSE, BUN, CREATININE, CALCIUM, GFRNONAA, GFRAA, ANIONGAP in the last 168 hours.  No results for input(s): PROT, ALBUMIN, AST, ALT, ALKPHOS, BILITOT in the  last 168 hours. HematologyNo results for input(s): WBC, RBC, HGB, HCT, MCV, MCH, MCHC, RDW, PLT in the last 168 hours. BNPNo results for input(s): BNP, PROBNP in the last 168 hours.  DDimer No results for input(s): DDIMER in the last 168 hours.   Radiology/Studies:  No results found.  Assessment and Plan:  1. Atrial tachycardia Incidental finding.  Asymptomatic.  Noted heart rate of 140 with rhythm looks like atrial tachycardia.  Currently heart rate controlled.  He is not on any medication at home.  His surgery has been canceled.  Dr. Debara Pickett to see.  He  may have intermittent tachycardia of unknown period of time.  Likely outpatient evaluation.   2. Elevated BP - no prior hx `   For questions or updates, please contact [place] Please consult [email or web address] for contact info under     Jarrett Soho, PA  05/18/2019 12:02 PM

## 2019-05-18 NOTE — Discharge Summary (Signed)
Patient was brought into short stay, found to have supraventricular tachycardia, never truly admitted to the hospital, cardiac consultation was performed, medications administered, discharged home from short stay.  We will plan to follow-up with cardiology, and we will reorganize the scheduling for his case once he has been optimized.

## 2019-05-18 NOTE — Anesthesia Preprocedure Evaluation (Signed)
Anesthesia Evaluation  Patient identified by MRN, date of birth, ID band Patient awake    Reviewed: Allergy & Precautions, NPO status , Patient's Chart, lab work & pertinent test results  Airway Mallampati: II  TM Distance: >3 FB Neck ROM: Full    Dental no notable dental hx.    Pulmonary neg pulmonary ROS,    Pulmonary exam normal breath sounds clear to auscultation       Cardiovascular negative cardio ROS Normal cardiovascular exam Rhythm:Regular Rate:Normal     Neuro/Psych Anxiety negative neurological ROS     GI/Hepatic negative GI ROS, Neg liver ROS,   Endo/Other  negative endocrine ROS  Renal/GU negative Renal ROS  negative genitourinary   Musculoskeletal  (+) Arthritis , Osteoarthritis,    Abdominal   Peds negative pediatric ROS (+)  Hematology negative hematology ROS (+)   Anesthesia Other Findings   Reproductive/Obstetrics negative OB ROS                             Anesthesia Physical Anesthesia Plan  ASA: II  Anesthesia Plan: Spinal   Post-op Pain Management:    Induction: Intravenous  PONV Risk Score and Plan: 2 and Ondansetron, Dexamethasone and Treatment may vary due to age or medical condition  Airway Management Planned: Simple Face Mask  Additional Equipment:   Intra-op Plan:   Post-operative Plan:   Informed Consent: I have reviewed the patients History and Physical, chart, labs and discussed the procedure including the risks, benefits and alternatives for the proposed anesthesia with the patient or authorized representative who has indicated his/her understanding and acceptance.     Dental advisory given  Plan Discussed with: CRNA and Surgeon  Anesthesia Plan Comments:         Anesthesia Quick Evaluation

## 2019-05-18 NOTE — Discharge Instructions (Signed)
Case cancelled due to:  cardiology consult needed, elevated Heart rate, symptomatic after walking to short stay (short of breath and diaphoretic)   Pt seen in Short stay by cardiologist : Dr Lyman Bishop, [facility]  Pt should follow up with Dr Debara Pickett in office. The office will call Pt to schedule appointment.  Pick up perscription today called in to your pharmacist and start as directed.  Call Dr [facility] office for questions / concerns should they arise before follow up.  [phone] ask for Dr [facility] nurse.  Call 911 for emergent needs :  Including Heart racing, chest pain, or shortness of breath.

## 2019-05-18 NOTE — H&P (Signed)
Patient was brought into short stay but found to have supraventricular tachycardia, anesthesia recommended delaying his surgery.  He is going to be discharged from short stay, with follow-up with cardiology.  Cardiology consult also performed/completed today.

## 2019-05-26 ENCOUNTER — Telehealth: Payer: Self-pay | Admitting: Internal Medicine

## 2019-05-26 NOTE — Telephone Encounter (Signed)
Patient calling to inform us that he will have to have someone accompany him to his appt on 05/28/19. Kevin Ball will be his ride and help him get around at his appointment.

## 2019-05-27 NOTE — Telephone Encounter (Signed)
Attempted to call patient back about appointment tomorrow Phone rang, no answer  Of note, patient's friend can assist him to lobby, but staff can assist him to exam room area

## 2019-05-28 ENCOUNTER — Other Ambulatory Visit: Payer: Self-pay

## 2019-05-28 ENCOUNTER — Encounter: Payer: Self-pay | Admitting: Internal Medicine

## 2019-05-28 ENCOUNTER — Ambulatory Visit: Payer: Medicare HMO | Admitting: Internal Medicine

## 2019-05-28 VITALS — BP 160/87 | HR 81 | Ht 69.0 in | Wt 243.6 lb

## 2019-05-28 DIAGNOSIS — I471 Supraventricular tachycardia: Secondary | ICD-10-CM | POA: Diagnosis not present

## 2019-05-28 MED ORDER — METOPROLOL SUCCINATE ER 25 MG PO TB24: 25.0000 mg | ORAL_TABLET | Freq: Every day | ORAL | 3 refills | Status: DC

## 2019-05-28 NOTE — Progress Notes (Signed)
OFFICE NOTE  Chief Complaint:  Hospital follow-up  Primary Care Physician: Asencion Noble, MD  HPI:  Kevin Ball is a 79 y.o. male with a past medial history significant for arthritis, kidney stones and various other minor ailments but overall has done well.  He was recently at [facility] in preparation for hip surgery at which time preoperatively was noted to go into an SVT.  He was asymptomatic with this.  He is on no medications.  I saw and evaluated him in consultation and recommended starting Toprol-XL 25 mg daily, obtaining an outpatient echo and follow-up with me.  He returns today to follow-up and was not started on beta-blocker nor obtained an echo.  He continues to be asymptomatic.  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  PMHx:  Past Medical History:  Diagnosis Date  . Anxiety   . Arthritis   . Complication of anesthesia    "passed out"  . Depression   . Dyspnea   . History of kidney stones   . Medical history non-contributory     Past Surgical History:  Procedure Laterality Date  . COLONOSCOPY N/A 08/14/2016   Procedure: COLONOSCOPY;  Surgeon: Rogene Houston, MD;  [facility];  Service: Endoscopy;  Laterality: N/A;  730  . KNEE ARTHROSCOPY Right   . NECK SURGERY    . POLYPECTOMY  08/14/2016   Procedure: POLYPECTOMY;  Surgeon: Rogene Houston, MD;  [facility]: [facility];  Service: Endoscopy;;  recto-sigmoid x2    FAMHx:  Family History  Problem Relation Age of Onset  . Cancer Mother     SOCHx:   reports that he has never smoked. He has never used smokeless tobacco. He reports that he does not drink alcohol or use drugs.  ALLERGIES:  Allergies  Allergen Reactions  . Penicillins Rash    Has patient had a PCN reaction causing immediate rash, facial/tongue/throat swelling, SOB or lightheadedness with hypotension: Yes Has patient had a PCN reaction causing severe  rash involving mucus membranes or skin necrosis: No Has patient had a PCN reaction that required hospitalization No Has patient had a PCN reaction occurring within the last 10 years: No If all of the above answers are "NO", then may proceed with Cephalosporin use.     ROS: Pertinent items noted in HPI and remainder of comprehensive ROS otherwise negative.  HOME MEDS: Current Outpatient Medications on File Prior to Visit  Medication Sig Dispense Refill  . acetaminophen (TYLENOL) 500 MG tablet Take 1,000 mg by mouth every 6 (six) hours as needed for moderate pain or headache.    . clonazePAM (KLONOPIN) 0.5 MG tablet Take 0.5 mg by mouth 3 (three) times daily as needed (leg cramps).     No current facility-administered medications on file prior to visit.    LABS/IMAGING: No results found for this or any previous visit (from the past 48 hour(s)). No results found.  LIPID PANEL: No results found for: CHOL, TRIG, HDL, CHOLHDL, VLDL, LDLCALC, LDLDIRECT   WEIGHTS: Wt Readings from Last 3 Encounters:  05/28/19 243 lb 9.6 oz (110.5 kg)  05/18/19 239 lb 6 oz (108.6 kg)  05/04/19 244 lb (110.7 kg)    VITALS: BP (!) 160/87   Pulse 81   Ht 5\' 9"  (1.753 m)   Wt 243 lb 9.6 oz (110.5 kg)   SpO2 97%   BMI 35.97 kg/m  EXAM: General appearance: alert and no distress Neck: no carotid bruit, no JVD and thyroid not enlarged, symmetric, no tenderness/mass/nodules Lungs: clear to auscultation bilaterally Heart: regular rate and rhythm Abdomen: soft, non-tender; bowel sounds normal; no masses,  no organomegaly Extremities: extremities normal, atraumatic, no cyanosis or edema Pulses: 2+ and symmetric Skin: Skin color, texture, turgor normal. No rashes or lesions Neurologic: Grossly normal Psych: Pleasant  EKG: Deferred  ASSESSMENT: 1. PSVT  PLAN: 1.   Mr. Shalaby had PSVT noted during preoperative monitoring for hip surgery.  The surgery was ultimately canceled.  I advised  starting Toprol-XL 25 mg daily but that has not yet occurred.  We sent in the prescription for him today.  There was some borderline hypertension but is not been on medication.  We will get an echo as well to rule out any structural abnormalities.  Plan follow-up afterwards.  If everything looks well, then he could be cleared for surgery at his convenience.  Pixie Casino, MD, [facility], [place] Director of the Advanced Lipid Disorders &  Cardiovascular Risk Reduction Clinic Diplomate of the American Board of Clinical Lipidology Attending Cardiologist  Direct Dial: [phone]  Fax: [phone]  Website:  [email or web address]   Nadean Corwin Nikitta Sobiech 05/28/2019, 1:26 PM

## 2019-05-28 NOTE — Patient Instructions (Signed)
Medication Instructions:  START metoprolol succinate 25mg  daily  *If you need a refill on your cardiac medications before your next appointment, please call your pharmacy*  Lab Work: NONE If you have labs (blood work) drawn today and your tests are completely normal, you will receive your results only by: Marland Kitchen MyChart Message (if you have MyChart) OR . A paper copy in the mail If you have any lab test that is abnormal or we need to change your treatment, we will call you to review the results.  Testing/Procedures: ECHO at [place]: At [facility], you and your health needs are our priority.  As part of our continuing mission to provide you with exceptional heart care, we have created designated Provider Care Teams.  These Care Teams include your primary Cardiologist (physician) and Advanced Practice Providers (APPs -  Physician Assistants and Nurse Practitioners) who all work together to provide you with the care you need, when you need it.  Your next appointment:   12 month(s)  The format for your next appointment:   In Person  Provider:   You may see Dr. Debara Pickett or one of the following Advanced Practice Providers on your designated Care Team:    Almyra Deforest, PA-C  Fabian Sharp, [place] or   Roby Lofts, [place]   Other Instructions

## 2019-05-31 NOTE — Telephone Encounter (Signed)
Patient seen last week.  

## 2019-06-01 ENCOUNTER — Ambulatory Visit (HOSPITAL_COMMUNITY)
Admission: RE | Admit: 2019-06-01 | Discharge: 2019-06-01 | Disposition: A | Discharge status: Home / Self Care | Payer: Medicare HMO | Source: Ambulatory Visit | Attending: Internal Medicine | Admitting: Internal Medicine

## 2019-06-01 ENCOUNTER — Other Ambulatory Visit: Payer: Self-pay

## 2019-06-01 DIAGNOSIS — I471 Supraventricular tachycardia: Secondary | ICD-10-CM | POA: Diagnosis not present

## 2019-06-01 NOTE — Progress Notes (Signed)
*  PRELIMINARY RESULTS* Echocardiogram 2D Echocardiogram has been performed.  Samuel Germany 06/01/2019, 11:30 AM

## 2019-08-06 DIAGNOSIS — Z79899 Other long term (current) drug therapy: Secondary | ICD-10-CM | POA: Diagnosis not present

## 2019-08-06 DIAGNOSIS — M1990 Unspecified osteoarthritis, unspecified site: Secondary | ICD-10-CM | POA: Diagnosis not present

## 2019-08-06 DIAGNOSIS — E669 Obesity, unspecified: Secondary | ICD-10-CM | POA: Diagnosis not present

## 2019-08-13 DIAGNOSIS — I10 Essential (primary) hypertension: Secondary | ICD-10-CM | POA: Diagnosis not present

## 2019-08-13 DIAGNOSIS — I479 Paroxysmal tachycardia, unspecified: Secondary | ICD-10-CM | POA: Diagnosis not present

## 2019-08-13 DIAGNOSIS — Z0001 Encounter for general adult medical examination with abnormal findings: Secondary | ICD-10-CM | POA: Diagnosis not present

## 2019-08-13 DIAGNOSIS — M1991 Primary osteoarthritis, unspecified site: Secondary | ICD-10-CM | POA: Diagnosis not present

## 2019-08-13 DIAGNOSIS — R252 Cramp and spasm: Secondary | ICD-10-CM | POA: Diagnosis not present

## 2019-09-23 DIAGNOSIS — I10 Essential (primary) hypertension: Secondary | ICD-10-CM | POA: Diagnosis not present

## 2019-09-23 DIAGNOSIS — I479 Paroxysmal tachycardia, unspecified: Secondary | ICD-10-CM | POA: Diagnosis not present

## 2019-11-11 DIAGNOSIS — I10 Essential (primary) hypertension: Secondary | ICD-10-CM | POA: Diagnosis not present

## 2019-11-11 DIAGNOSIS — I479 Paroxysmal tachycardia, unspecified: Secondary | ICD-10-CM | POA: Diagnosis not present

## 2020-02-11 DIAGNOSIS — I10 Essential (primary) hypertension: Secondary | ICD-10-CM | POA: Diagnosis not present

## 2020-02-11 DIAGNOSIS — I479 Paroxysmal tachycardia, unspecified: Secondary | ICD-10-CM | POA: Diagnosis not present

## 2020-06-15 DIAGNOSIS — I471 Supraventricular tachycardia: Secondary | ICD-10-CM | POA: Diagnosis not present

## 2020-06-15 DIAGNOSIS — I10 Essential (primary) hypertension: Secondary | ICD-10-CM | POA: Diagnosis not present

## 2020-06-21 ENCOUNTER — Other Ambulatory Visit: Payer: Self-pay

## 2020-06-21 ENCOUNTER — Encounter: Payer: Self-pay | Admitting: Internal Medicine

## 2020-06-21 ENCOUNTER — Ambulatory Visit: Payer: Medicare HMO | Admitting: Internal Medicine

## 2020-06-21 VITALS — BP 152/78 | Ht 68.75 in | Wt 269.6 lb

## 2020-06-21 DIAGNOSIS — I10 Essential (primary) hypertension: Secondary | ICD-10-CM

## 2020-06-21 DIAGNOSIS — I471 Supraventricular tachycardia: Secondary | ICD-10-CM

## 2020-06-21 NOTE — Progress Notes (Signed)
OFFICE NOTE  Chief Complaint:  Follow-up  Primary Care Physician: Asencion Noble, MD  HPI:  Kevin Ball is a 80 y.o. male with a past medial history significant for arthritis, kidney stones and various other minor ailments but overall has done well.  He was recently at [facility] long hospital in preparation for hip surgery at which time preoperatively was noted to go into an SVT.  He was asymptomatic with this.  He is on no medications.  I saw and evaluated him in consultation and recommended starting Toprol-XL 25 mg daily, obtaining an outpatient echo and follow-up with me.  He returns today to follow-up and was not started on beta-blocker nor obtained an echo.  He continues to be asymptomatic.  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  06/21/2020  Mr. Defina is seen today in follow-up.  He denies any recurrent SVT.  Recently his PCP increased his Toprol to 50 mg daily for hypertension and he was started on losartan.  He has had some improvement in that.  He never ultimately got his surgery.  PMHx:  Past Medical History:  Diagnosis Date  . Anxiety   . Arthritis   . Complication of anesthesia    "passed out"  . Depression   . Dyspnea   . History of kidney stones   . Medical history non-contributory     Past Surgical History:  Procedure Laterality Date  . COLONOSCOPY N/A 08/14/2016   Procedure: COLONOSCOPY;  Surgeon: Rogene Houston, MD;  [facility];  Service: Endoscopy;  Laterality: N/A;  730  . KNEE ARTHROSCOPY Right   . NECK SURGERY    . POLYPECTOMY  08/14/2016   Procedure: POLYPECTOMY;  Surgeon: Rogene Houston, MD;  [facility];  Service: Endoscopy;;  recto-sigmoid x2    FAMHx:  Family History  Problem Relation Age of Onset  . Cancer Mother     SOCHx:   reports that he has never smoked. He has never used smokeless tobacco. He reports that he does not drink alcohol and  does not use drugs.  ALLERGIES:  Allergies  Allergen Reactions  . Penicillins Rash    Has patient had a PCN reaction causing immediate rash, facial/tongue/throat swelling, SOB or lightheadedness with hypotension: Yes Has patient had a PCN reaction causing severe rash involving mucus membranes or skin necrosis: No Has patient had a PCN reaction that required hospitalization No Has patient had a PCN reaction occurring within the last 10 years: No If all of the above answers are "NO", then may proceed with Cephalosporin use.     ROS: Pertinent items noted in HPI and remainder of comprehensive ROS otherwise negative.  HOME MEDS: Current Outpatient Medications on File Prior to Visit  Medication Sig Dispense Refill  . acetaminophen (TYLENOL) 500 MG tablet Take 1,000 mg by mouth every 6 (six) hours as needed for moderate pain or headache.    . clonazePAM (KLONOPIN) 0.5 MG tablet Take 0.5 mg by mouth 3 (three) times daily as needed (leg cramps).    . losartan (COZAAR) 50 MG tablet Take 50 mg by mouth daily.    . metoprolol succinate (TOPROL-XL) 50 MG 24 hr tablet Take 50 mg by mouth daily.     No current facility-administered medications on file prior to visit.    LABS/IMAGING: No results found for this or any previous visit (from the past 48 hour(s)). No results  found.  LIPID PANEL: No results found for: CHOL, TRIG, HDL, CHOLHDL, VLDL, LDLCALC, LDLDIRECT   WEIGHTS: Wt Readings from Last 3 Encounters:  06/21/20 269 lb 9.6 oz (122.3 kg)  05/28/19 243 lb 9.6 oz (110.5 kg)  05/18/19 239 lb 6 oz (108.6 kg)    VITALS: BP (!) 152/78 (BP Location: Left Arm, Patient Position: Sitting)   Ht 5' 8.75" (1.746 m)   Wt 269 lb 9.6 oz (122.3 kg)   SpO2 96%   BMI 40.10 kg/m   EXAM: General appearance: alert and no distress Neck: no carotid bruit, no JVD and thyroid not enlarged, symmetric, no tenderness/mass/nodules Lungs: clear to auscultation bilaterally Heart: regular rate and  rhythm Abdomen: soft, non-tender; bowel sounds normal; no masses,  no organomegaly Extremities: extremities normal, atraumatic, no cyanosis or edema Pulses: 2+ and symmetric Skin: Skin color, texture, turgor normal. No rashes or lesions Neurologic: Grossly normal Psych: Pleasant  EKG: Sinus rhythm short PR at 76-personally reviewed  ASSESSMENT: 1. PSVT 2. Essential hypertension  PLAN: 1.   Mr. Passow denies any recurrent SVT however he was not really aware of it initially.  Recently his PCP increased his Toprol further and added losartan for better blood pressure control.  Blood pressure was elevated today however he says at home it is better controlled.  I have encouraged continued physical activity.  He should be more busy with the weather improving [place] his [place].  I am happy to see him back as needed.  Pixie Casino, MD, [facility], [place] Director of the Advanced Lipid Disorders &  Cardiovascular Risk Reduction Clinic Diplomate of the American Board of Clinical Lipidology Attending Cardiologist  Direct Dial: [phone]  Fax: [phone]  Website:  [email or web address] [email or web address] 06/21/2020, 3:36 PM

## 2020-06-21 NOTE — Patient Instructions (Signed)

## 2020-08-11 DIAGNOSIS — I471 Supraventricular tachycardia: Secondary | ICD-10-CM | POA: Diagnosis not present

## 2020-08-11 DIAGNOSIS — Z79899 Other long term (current) drug therapy: Secondary | ICD-10-CM | POA: Diagnosis not present

## 2020-08-11 DIAGNOSIS — I10 Essential (primary) hypertension: Secondary | ICD-10-CM | POA: Diagnosis not present

## 2020-08-11 DIAGNOSIS — M1990 Unspecified osteoarthritis, unspecified site: Secondary | ICD-10-CM | POA: Diagnosis not present

## 2020-08-18 DIAGNOSIS — N1831 Chronic kidney disease, stage 3a: Secondary | ICD-10-CM | POA: Diagnosis not present

## 2020-08-18 DIAGNOSIS — I10 Essential (primary) hypertension: Secondary | ICD-10-CM | POA: Diagnosis not present

## 2020-08-18 DIAGNOSIS — Z6838 Body mass index (BMI) 38.0-38.9, adult: Secondary | ICD-10-CM | POA: Diagnosis not present

## 2020-08-18 DIAGNOSIS — I471 Supraventricular tachycardia: Secondary | ICD-10-CM | POA: Diagnosis not present

## 2020-12-11 DIAGNOSIS — Z79899 Other long term (current) drug therapy: Secondary | ICD-10-CM | POA: Diagnosis not present

## 2020-12-11 DIAGNOSIS — N1831 Chronic kidney disease, stage 3a: Secondary | ICD-10-CM | POA: Diagnosis not present

## 2020-12-11 DIAGNOSIS — M1990 Unspecified osteoarthritis, unspecified site: Secondary | ICD-10-CM | POA: Diagnosis not present

## 2020-12-11 DIAGNOSIS — I10 Essential (primary) hypertension: Secondary | ICD-10-CM | POA: Diagnosis not present

## 2020-12-18 DIAGNOSIS — N183 Chronic kidney disease, stage 3 unspecified: Secondary | ICD-10-CM | POA: Diagnosis not present

## 2020-12-18 DIAGNOSIS — I10 Essential (primary) hypertension: Secondary | ICD-10-CM | POA: Diagnosis not present

## 2021-04-20 DIAGNOSIS — N1831 Chronic kidney disease, stage 3a: Secondary | ICD-10-CM | POA: Diagnosis not present

## 2021-04-20 DIAGNOSIS — M1990 Unspecified osteoarthritis, unspecified site: Secondary | ICD-10-CM | POA: Diagnosis not present

## 2021-04-20 DIAGNOSIS — Z79899 Other long term (current) drug therapy: Secondary | ICD-10-CM | POA: Diagnosis not present

## 2021-04-20 DIAGNOSIS — I10 Essential (primary) hypertension: Secondary | ICD-10-CM | POA: Diagnosis not present

## 2021-04-20 DIAGNOSIS — I471 Supraventricular tachycardia: Secondary | ICD-10-CM | POA: Diagnosis not present

## 2021-04-26 ENCOUNTER — Other Ambulatory Visit (HOSPITAL_COMMUNITY): Payer: Self-pay | Admitting: Internal Medicine

## 2021-04-26 DIAGNOSIS — I10 Essential (primary) hypertension: Secondary | ICD-10-CM | POA: Diagnosis not present

## 2021-04-26 DIAGNOSIS — R0789 Other chest pain: Secondary | ICD-10-CM | POA: Diagnosis not present

## 2021-04-26 DIAGNOSIS — N1831 Chronic kidney disease, stage 3a: Secondary | ICD-10-CM | POA: Diagnosis not present

## 2021-04-27 ENCOUNTER — Ambulatory Visit (HOSPITAL_COMMUNITY)
Admission: RE | Admit: 2021-04-27 | Discharge: 2021-04-27 | Disposition: A | Discharge status: Home / Self Care | Payer: Medicare HMO | Source: Ambulatory Visit | Attending: Internal Medicine | Admitting: Internal Medicine

## 2021-04-27 ENCOUNTER — Other Ambulatory Visit: Payer: Self-pay

## 2021-04-27 DIAGNOSIS — R0789 Other chest pain: Secondary | ICD-10-CM | POA: Insufficient documentation

## 2021-04-27 DIAGNOSIS — R0781 Pleurodynia: Secondary | ICD-10-CM | POA: Diagnosis not present

## 2021-06-12 DIAGNOSIS — H524 Presbyopia: Secondary | ICD-10-CM | POA: Diagnosis not present

## 2021-06-20 DIAGNOSIS — Z0100 Encounter for examination of eyes and vision without abnormal findings: Secondary | ICD-10-CM | POA: Diagnosis not present

## 2021-07-09 DIAGNOSIS — I10 Essential (primary) hypertension: Secondary | ICD-10-CM | POA: Diagnosis not present

## 2021-07-09 DIAGNOSIS — Z0001 Encounter for general adult medical examination with abnormal findings: Secondary | ICD-10-CM | POA: Diagnosis not present

## 2021-08-13 DIAGNOSIS — M1990 Unspecified osteoarthritis, unspecified site: Secondary | ICD-10-CM | POA: Diagnosis not present

## 2021-08-13 DIAGNOSIS — Z79899 Other long term (current) drug therapy: Secondary | ICD-10-CM | POA: Diagnosis not present

## 2021-08-13 DIAGNOSIS — I10 Essential (primary) hypertension: Secondary | ICD-10-CM | POA: Diagnosis not present

## 2021-08-13 DIAGNOSIS — N1831 Chronic kidney disease, stage 3a: Secondary | ICD-10-CM | POA: Diagnosis not present

## 2021-08-20 DIAGNOSIS — Z6838 Body mass index (BMI) 38.0-38.9, adult: Secondary | ICD-10-CM | POA: Diagnosis not present

## 2021-08-20 DIAGNOSIS — I471 Supraventricular tachycardia: Secondary | ICD-10-CM | POA: Diagnosis not present

## 2021-08-20 DIAGNOSIS — M150 Primary generalized (osteo)arthritis: Secondary | ICD-10-CM | POA: Diagnosis not present

## 2021-08-20 DIAGNOSIS — I10 Essential (primary) hypertension: Secondary | ICD-10-CM | POA: Diagnosis not present

## 2021-11-21 IMAGING — MR MR HIP*L* W/O CM
4 of 5 series · 14 of 40 positions shown · non-contrast
Comparison: Plain films left hip 11/20/2018.

CLINICAL DATA: Left hip pain for 4 months.  No known injury.

EXAM:
MR OF THE LEFT HIP WITHOUT CONTRAST
TECHNIQUE: Multiplanar, multisequence MR imaging was performed. No intravenous
contrast was administered.

[Series 2: T1 · coronal · 4.0mm · 0.50mm/px · 3 of 24 slices shown]
[im 3/24]
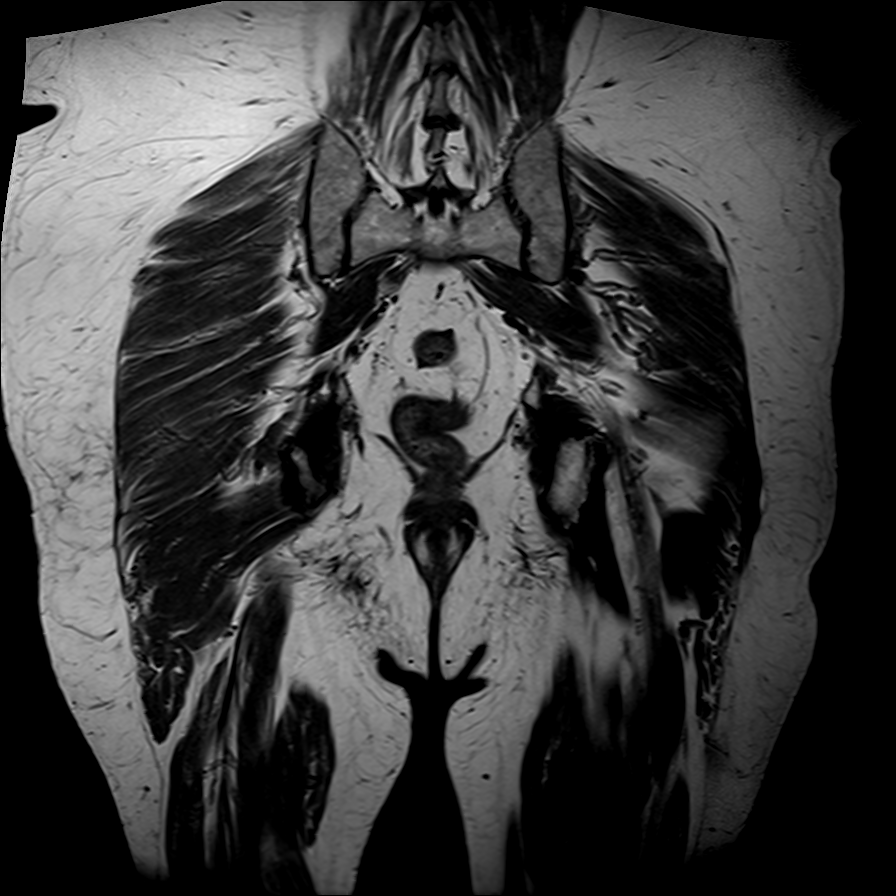
[im 12/24]
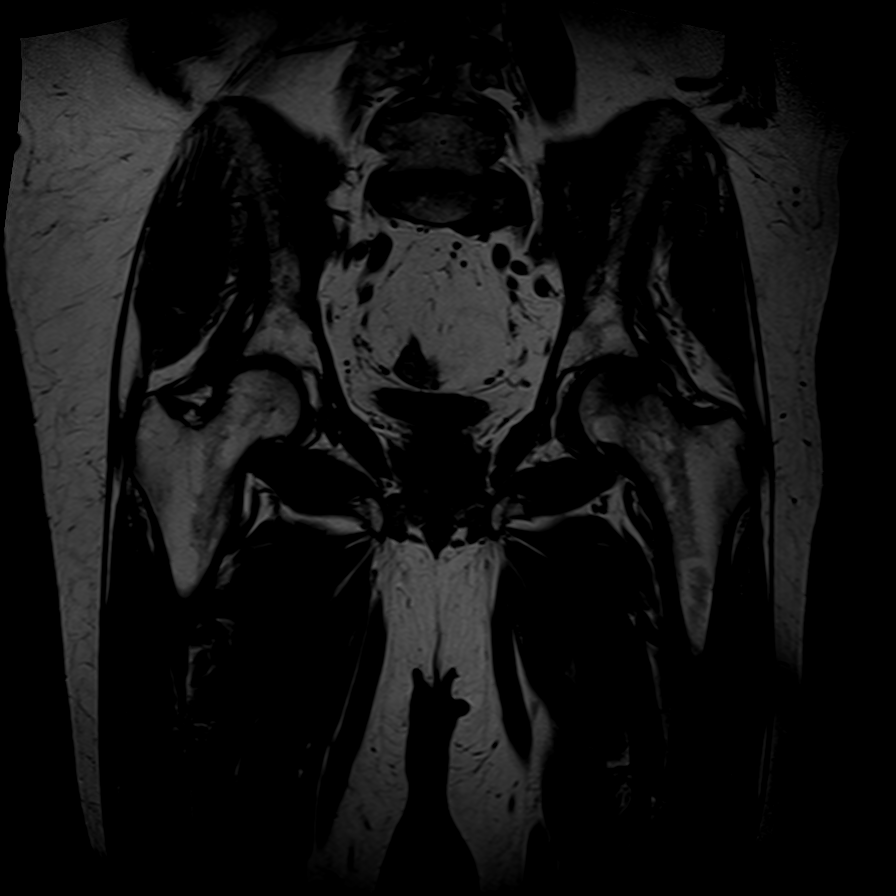
[im 21/24]
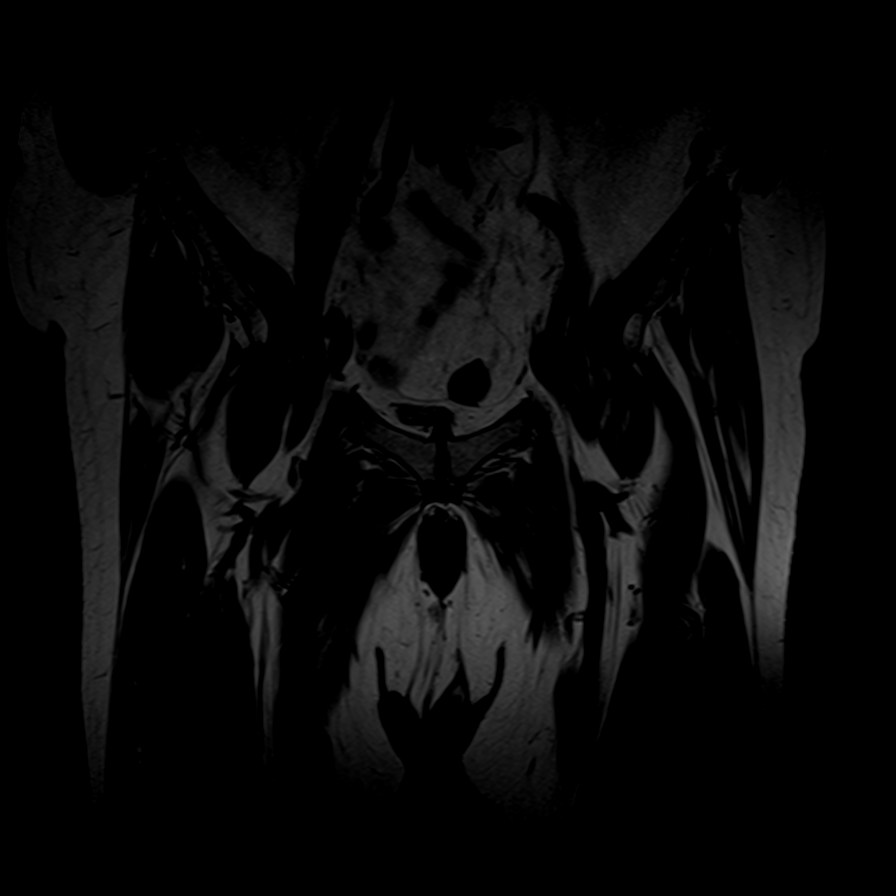

[Series 4: T2 fat-sat · axial · 4.0mm · 0.39mm/px · z∈[-75,+5]mm · 3 of 24 slices shown]
[im 4/24]
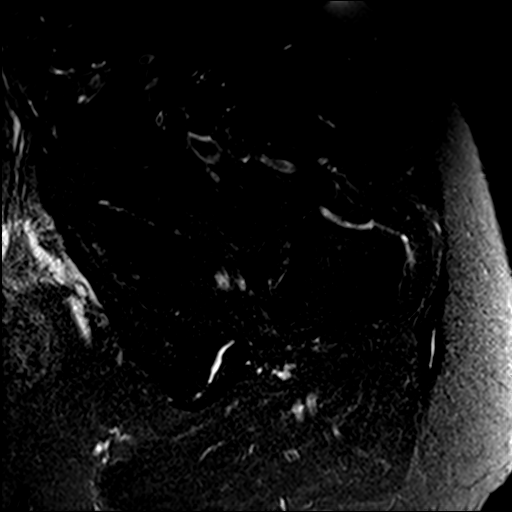
[im 14/24]
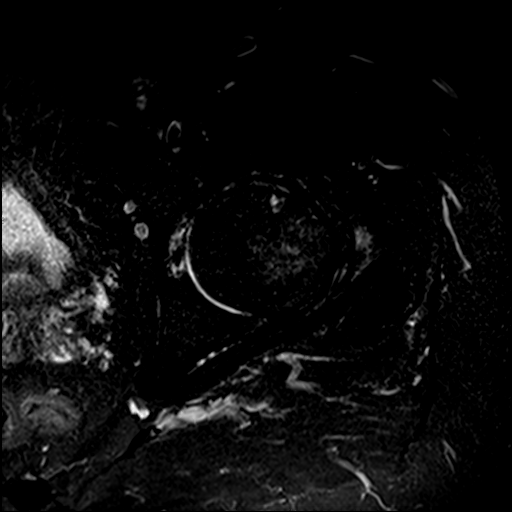
[im 20/24]
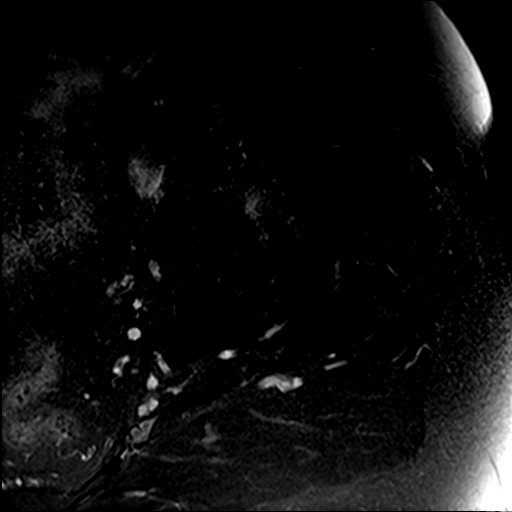

[Series 5: PD fat-sat · sagittal · 4.0mm · 0.35mm/px · 5 of 24 slices shown (1 of 2)]
[im 1/24]
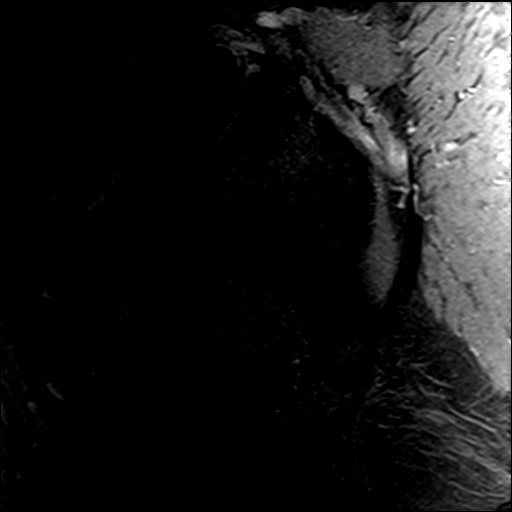
[im 4/24]
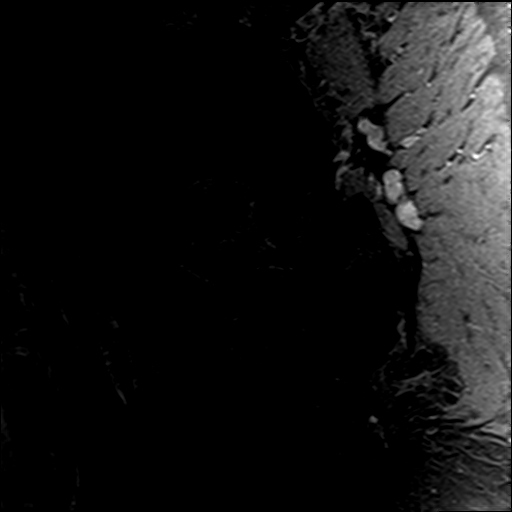
[im 7/24]
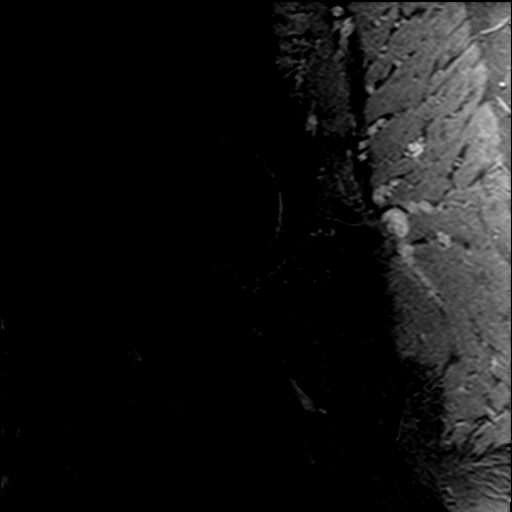
[im 14/24]
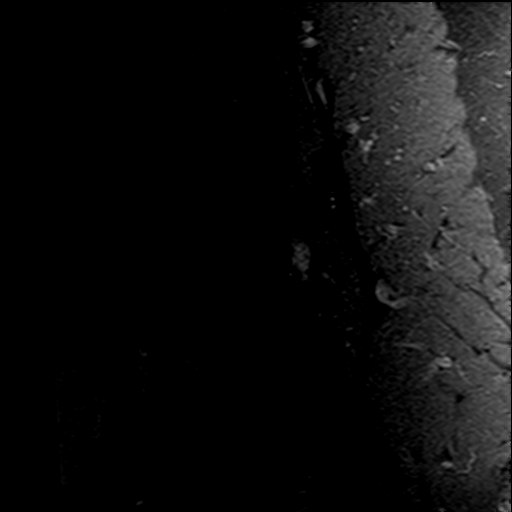
[im 20/24]
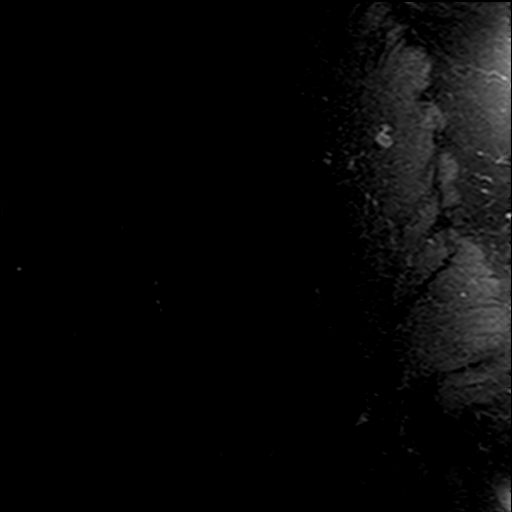

[Series 6: PD fat-sat · coronal · 4.0mm · 0.35mm/px · 3 of 22 slices shown (2 of 2)]
[im 4/22]
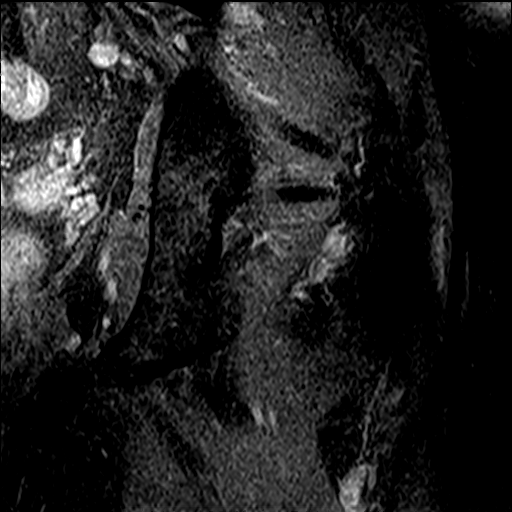
[im 11/22]
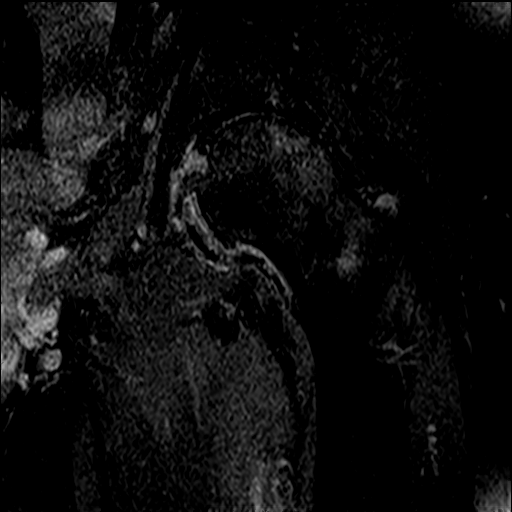
[im 18/22]
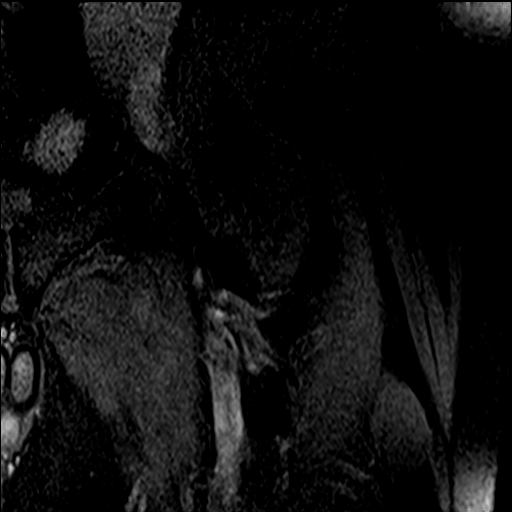

[14 of 40 positions shown; findings below may reference images not displayed]

FINDINGS: Bones: There is extensive subchondral edema about the left hip.
Osteophytosis about both femoral heads is much worse on the left.
Edema extending into the left femoral neck is compatible with
secondary stress change. No fracture.

Articular cartilage and labrum

Articular cartilage: Completely denuded on the left with associated
bone-on-bone joint space narrowing.

Labrum:  Diffusely degenerated and torn.

Joint or bursal effusion

Joint effusion:  None.

Bursae: Negative.

Muscles and tendons

Muscles and tendons:  Intact and normal in appearance.

Other findings

Miscellaneous: Imaged intrapelvic contents demonstrate mild
prostatomegaly.
IMPRESSION: Left much worse than right hip osteoarthritis with extensive
subchondral edema about the left hip and degenerative tearing of the
left labrum. Edema extending into the left femoral neck is
consistent with secondary stress change.

Mild prostatomegaly.

## 2021-12-20 DIAGNOSIS — N401 Enlarged prostate with lower urinary tract symptoms: Secondary | ICD-10-CM | POA: Diagnosis not present

## 2021-12-20 DIAGNOSIS — I10 Essential (primary) hypertension: Secondary | ICD-10-CM | POA: Diagnosis not present

## 2022-04-26 DIAGNOSIS — I10 Essential (primary) hypertension: Secondary | ICD-10-CM | POA: Diagnosis not present

## 2022-04-26 DIAGNOSIS — M1612 Unilateral primary osteoarthritis, left hip: Secondary | ICD-10-CM | POA: Diagnosis not present

## 2022-05-03 DIAGNOSIS — M1612 Unilateral primary osteoarthritis, left hip: Secondary | ICD-10-CM | POA: Diagnosis not present

## 2022-08-15 DIAGNOSIS — M1990 Unspecified osteoarthritis, unspecified site: Secondary | ICD-10-CM | POA: Diagnosis not present

## 2022-08-15 DIAGNOSIS — I10 Essential (primary) hypertension: Secondary | ICD-10-CM | POA: Diagnosis not present

## 2022-08-15 DIAGNOSIS — M5080 Other cervical disc disorders, unspecified cervical region: Secondary | ICD-10-CM | POA: Diagnosis not present

## 2022-08-15 DIAGNOSIS — N400 Enlarged prostate without lower urinary tract symptoms: Secondary | ICD-10-CM | POA: Diagnosis not present

## 2022-08-15 DIAGNOSIS — N1831 Chronic kidney disease, stage 3a: Secondary | ICD-10-CM | POA: Diagnosis not present

## 2022-08-15 DIAGNOSIS — N200 Calculus of kidney: Secondary | ICD-10-CM | POA: Diagnosis not present

## 2022-08-15 DIAGNOSIS — Z79899 Other long term (current) drug therapy: Secondary | ICD-10-CM | POA: Diagnosis not present

## 2022-08-15 DIAGNOSIS — I471 Supraventricular tachycardia, unspecified: Secondary | ICD-10-CM | POA: Diagnosis not present

## 2022-08-22 DIAGNOSIS — M1612 Unilateral primary osteoarthritis, left hip: Secondary | ICD-10-CM | POA: Diagnosis not present

## 2022-08-22 DIAGNOSIS — N1831 Chronic kidney disease, stage 3a: Secondary | ICD-10-CM | POA: Diagnosis not present

## 2022-08-22 DIAGNOSIS — N400 Enlarged prostate without lower urinary tract symptoms: Secondary | ICD-10-CM | POA: Diagnosis not present

## 2022-08-22 DIAGNOSIS — Z0000 Encounter for general adult medical examination without abnormal findings: Secondary | ICD-10-CM | POA: Diagnosis not present

## 2022-08-22 DIAGNOSIS — I10 Essential (primary) hypertension: Secondary | ICD-10-CM | POA: Diagnosis not present

## 2022-08-22 DIAGNOSIS — I471 Supraventricular tachycardia, unspecified: Secondary | ICD-10-CM | POA: Diagnosis not present

## 2023-07-01 DIAGNOSIS — H524 Presbyopia: Secondary | ICD-10-CM | POA: Diagnosis not present

## 2023-07-01 DIAGNOSIS — Z0100 Encounter for examination of eyes and vision without abnormal findings: Secondary | ICD-10-CM | POA: Diagnosis not present

## 2023-08-18 DIAGNOSIS — Z79899 Other long term (current) drug therapy: Secondary | ICD-10-CM | POA: Diagnosis not present

## 2023-08-18 DIAGNOSIS — I10 Essential (primary) hypertension: Secondary | ICD-10-CM | POA: Diagnosis not present

## 2023-08-18 DIAGNOSIS — M1990 Unspecified osteoarthritis, unspecified site: Secondary | ICD-10-CM | POA: Diagnosis not present

## 2023-08-18 DIAGNOSIS — N1831 Chronic kidney disease, stage 3a: Secondary | ICD-10-CM | POA: Diagnosis not present

## 2023-08-18 DIAGNOSIS — N400 Enlarged prostate without lower urinary tract symptoms: Secondary | ICD-10-CM | POA: Diagnosis not present

## 2023-08-18 DIAGNOSIS — I471 Supraventricular tachycardia, unspecified: Secondary | ICD-10-CM | POA: Diagnosis not present

## 2023-08-25 DIAGNOSIS — M1612 Unilateral primary osteoarthritis, left hip: Secondary | ICD-10-CM | POA: Diagnosis not present

## 2023-08-25 DIAGNOSIS — I471 Supraventricular tachycardia, unspecified: Secondary | ICD-10-CM | POA: Diagnosis not present

## 2023-08-25 DIAGNOSIS — I10 Essential (primary) hypertension: Secondary | ICD-10-CM | POA: Diagnosis not present

## 2023-08-25 DIAGNOSIS — N400 Enlarged prostate without lower urinary tract symptoms: Secondary | ICD-10-CM | POA: Diagnosis not present

## 2023-08-25 DIAGNOSIS — Z0001 Encounter for general adult medical examination with abnormal findings: Secondary | ICD-10-CM | POA: Diagnosis not present

## 2023-12-16 DIAGNOSIS — H2512 Age-related nuclear cataract, left eye: Secondary | ICD-10-CM | POA: Diagnosis not present

## 2024-01-12 DIAGNOSIS — Z888 Allergy status to other drugs, medicaments and biological substances status: Secondary | ICD-10-CM | POA: Diagnosis not present

## 2024-01-12 DIAGNOSIS — H5703 Miosis: Secondary | ICD-10-CM | POA: Diagnosis not present

## 2024-01-12 DIAGNOSIS — I10 Essential (primary) hypertension: Secondary | ICD-10-CM | POA: Diagnosis not present

## 2024-01-12 DIAGNOSIS — M1990 Unspecified osteoarthritis, unspecified site: Secondary | ICD-10-CM | POA: Diagnosis not present

## 2024-01-12 DIAGNOSIS — H2512 Age-related nuclear cataract, left eye: Secondary | ICD-10-CM | POA: Diagnosis not present

## 2024-01-12 DIAGNOSIS — Z880 Allergy status to penicillin: Secondary | ICD-10-CM | POA: Diagnosis not present

## 2024-01-12 DIAGNOSIS — H25812 Combined forms of age-related cataract, left eye: Secondary | ICD-10-CM | POA: Diagnosis not present

## 2024-01-21 DIAGNOSIS — R55 Syncope and collapse: Secondary | ICD-10-CM | POA: Diagnosis not present

## 2024-01-21 DIAGNOSIS — I493 Ventricular premature depolarization: Secondary | ICD-10-CM | POA: Diagnosis not present

## 2024-01-22 ENCOUNTER — Telehealth: Payer: Self-pay | Admitting: *Deleted

## 2024-01-22 ENCOUNTER — Ambulatory Visit: Attending: Cardiology

## 2024-01-22 DIAGNOSIS — R55 Syncope and collapse: Secondary | ICD-10-CM

## 2024-01-22 NOTE — Telephone Encounter (Signed)
 Fax for Dr. Sheryle requesting a 7 day Zio patch for syncope.

## 2024-02-17 DIAGNOSIS — Z79899 Other long term (current) drug therapy: Secondary | ICD-10-CM | POA: Diagnosis not present

## 2024-02-17 DIAGNOSIS — N200 Calculus of kidney: Secondary | ICD-10-CM | POA: Diagnosis not present

## 2024-02-24 DIAGNOSIS — R55 Syncope and collapse: Secondary | ICD-10-CM | POA: Diagnosis not present

## 2024-02-24 DIAGNOSIS — I10 Essential (primary) hypertension: Secondary | ICD-10-CM | POA: Diagnosis not present

## 2024-02-24 DIAGNOSIS — N183 Chronic kidney disease, stage 3 unspecified: Secondary | ICD-10-CM | POA: Diagnosis not present

## 2024-02-24 DIAGNOSIS — Z23 Encounter for immunization: Secondary | ICD-10-CM | POA: Diagnosis not present

## 2024-02-24 NOTE — Telephone Encounter (Signed)
 Caller Audria) is following-up on the results of patient's Zio monitor.

## 2024-02-25 NOTE — Telephone Encounter (Signed)
 Spoke with Romero at Dr. Marily office. Informed Romero that according to [employer] the monitor was returned without data. Romero states that when the next monitor is sent that their office will assist pt in placing monitor. Email sent to Mliss Cross with [employer] requesting another monitor.

## 2024-02-25 NOTE — Telephone Encounter (Signed)
 Returned call to [place]. Line is busy.

## 2024-03-12 ENCOUNTER — Ambulatory Visit (HOSPITAL_COMMUNITY)
Admission: RE | Admit: 2024-03-12 | Discharge: 2024-03-12 | Disposition: A | Discharge status: Home / Self Care | Source: Ambulatory Visit | Attending: Internal Medicine | Admitting: Internal Medicine

## 2024-03-12 ENCOUNTER — Other Ambulatory Visit (HOSPITAL_COMMUNITY): Payer: Self-pay | Admitting: Internal Medicine

## 2024-03-12 DIAGNOSIS — R52 Pain, unspecified: Secondary | ICD-10-CM

## 2024-03-12 DIAGNOSIS — M25521 Pain in right elbow: Secondary | ICD-10-CM | POA: Diagnosis not present

## 2024-03-15 DIAGNOSIS — M109 Gout, unspecified: Secondary | ICD-10-CM | POA: Diagnosis not present

## 2024-03-15 DIAGNOSIS — N183 Chronic kidney disease, stage 3 unspecified: Secondary | ICD-10-CM | POA: Diagnosis not present

## 2024-03-16 DIAGNOSIS — R55 Syncope and collapse: Secondary | ICD-10-CM | POA: Diagnosis not present

## 2024-05-19 DIAGNOSIS — R55 Syncope and collapse: Secondary | ICD-10-CM
